# Patient Record
Sex: Male | Born: 1960 | Race: White | Hispanic: No | Marital: Single | Smoking: Light tobacco smoker
Health system: Southern US, Community
[De-identification: ages and names within clinical notes are randomized; demographics above are authoritative.]

## PROBLEM LIST (undated history)

## (undated) DIAGNOSIS — I480 Paroxysmal atrial fibrillation: Secondary | ICD-10-CM

## (undated) DIAGNOSIS — M1990 Unspecified osteoarthritis, unspecified site: Secondary | ICD-10-CM

## (undated) DIAGNOSIS — Z9289 Personal history of other medical treatment: Secondary | ICD-10-CM

## (undated) DIAGNOSIS — J309 Allergic rhinitis, unspecified: Secondary | ICD-10-CM

## (undated) HISTORY — DX: Personal history of other medical treatment: Z92.89

## (undated) HISTORY — DX: Unspecified osteoarthritis, unspecified site: M19.90

## (undated) HISTORY — DX: Paroxysmal atrial fibrillation: I48.0

---

## 2014-12-18 ENCOUNTER — Observation Stay (HOSPITAL_COMMUNITY)
Admission: EM | Admit: 2014-12-18 | Discharge: 2014-12-19 | Disposition: A | Discharge status: Home / Self Care | Payer: BLUE CROSS/BLUE SHIELD | Attending: Internal Medicine | Admitting: Internal Medicine

## 2014-12-18 ENCOUNTER — Emergency Department (HOSPITAL_COMMUNITY): Payer: BLUE CROSS/BLUE SHIELD

## 2014-12-18 ENCOUNTER — Other Ambulatory Visit: Payer: Self-pay

## 2014-12-18 ENCOUNTER — Encounter (HOSPITAL_COMMUNITY): Payer: Self-pay | Admitting: *Deleted

## 2014-12-18 DIAGNOSIS — I480 Paroxysmal atrial fibrillation: Principal | ICD-10-CM | POA: Insufficient documentation

## 2014-12-18 DIAGNOSIS — F17210 Nicotine dependence, cigarettes, uncomplicated: Secondary | ICD-10-CM | POA: Diagnosis not present

## 2014-12-18 DIAGNOSIS — Z720 Tobacco use: Secondary | ICD-10-CM

## 2014-12-18 DIAGNOSIS — I4891 Unspecified atrial fibrillation: Secondary | ICD-10-CM | POA: Diagnosis present

## 2014-12-18 HISTORY — DX: Allergic rhinitis, unspecified: J30.9

## 2014-12-18 LAB — CBC WITH DIFFERENTIAL/PLATELET
BASOS ABS: 0 10*3/uL (ref 0.0–0.1)
BASOS PCT: 0 %
EOS ABS: 0.1 10*3/uL (ref 0.0–0.7)
Eosinophils Relative: 2 %
HCT: 41.8 % (ref 39.0–52.0)
HEMOGLOBIN: 14.1 g/dL (ref 13.0–17.0)
Lymphocytes Relative: 25 %
Lymphs Abs: 2.1 10*3/uL (ref 0.7–4.0)
MCH: 32.6 pg (ref 26.0–34.0)
MCHC: 33.7 g/dL (ref 30.0–36.0)
MCV: 96.8 fL (ref 78.0–100.0)
Monocytes Absolute: 0.6 10*3/uL (ref 0.1–1.0)
Monocytes Relative: 8 %
NEUTROS PCT: 65 %
Neutro Abs: 5.4 10*3/uL (ref 1.7–7.7)
Platelets: 148 10*3/uL — ABNORMAL LOW (ref 150–400)
RBC: 4.32 MIL/uL (ref 4.22–5.81)
RDW: 12.4 % (ref 11.5–15.5)
WBC: 8.3 10*3/uL (ref 4.0–10.5)

## 2014-12-18 LAB — COMPREHENSIVE METABOLIC PANEL
ALBUMIN: 3.8 g/dL (ref 3.5–5.0)
ALT: 16 U/L — AB (ref 17–63)
ANION GAP: 8 (ref 5–15)
AST: 17 U/L (ref 15–41)
Alkaline Phosphatase: 45 U/L (ref 38–126)
BUN: 14 mg/dL (ref 6–20)
CALCIUM: 9 mg/dL (ref 8.9–10.3)
CO2: 27 mmol/L (ref 22–32)
Chloride: 102 mmol/L (ref 101–111)
Creatinine, Ser: 0.92 mg/dL (ref 0.61–1.24)
GFR calc Af Amer: >60 mL/min (ref >60)
GFR calc non Af Amer: >60 mL/min (ref >60)
GLUCOSE: 121 mg/dL — AB (ref 65–99)
Potassium: 3.8 mmol/L (ref 3.5–5.1)
SODIUM: 137 mmol/L (ref 135–145)
Total Bilirubin: 0.6 mg/dL (ref 0.3–1.2)
Total Protein: 5.7 g/dL — ABNORMAL LOW (ref 6.5–8.1)

## 2014-12-18 LAB — TSH: TSH: 1.276 u[IU]/mL (ref 0.350–4.500)

## 2014-12-18 LAB — MAGNESIUM: Magnesium: 2.2 mg/dL (ref 1.7–2.4)

## 2014-12-18 LAB — I-STAT TROPONIN, ED: Troponin i, poc: 0 ng/mL (ref 0.00–0.08)

## 2014-12-18 LAB — HEPARIN LEVEL (UNFRACTIONATED)
HEPARIN UNFRACTIONATED: 0.31 [IU]/mL (ref 0.30–0.70)
HEPARIN UNFRACTIONATED: 0.39 [IU]/mL (ref 0.30–0.70)

## 2014-12-18 MED ORDER — METOPROLOL TARTRATE 25 MG PO TABS
25.0000 mg | ORAL_TABLET | Freq: Once | ORAL | Status: AC
  Administered 2014-12-18: 25 mg via ORAL
  Filled 2014-12-18: qty 1

## 2014-12-18 MED ORDER — HEPARIN BOLUS VIA INFUSION
4000.0000 [IU] | Freq: Once | INTRAVENOUS | Status: AC
  Administered 2014-12-18: 4000 [IU] via INTRAVENOUS
  Filled 2014-12-18: qty 4000

## 2014-12-18 MED ORDER — ACETAMINOPHEN 325 MG PO TABS: 650.0000 mg | ORAL_TABLET | ORAL | Status: DC | PRN

## 2014-12-18 MED ORDER — HEPARIN (PORCINE) IN NACL 100-0.45 UNIT/ML-% IJ SOLN
1450.0000 [IU]/h | INTRAMUSCULAR | Status: DC
  Administered 2014-12-18: 1400 [IU]/h via INTRAVENOUS
  Administered 2014-12-18: 1450 [IU]/h via INTRAVENOUS
  Filled 2014-12-18 (×2): qty 250

## 2014-12-18 MED ORDER — METOPROLOL TARTRATE 25 MG PO TABS
25.0000 mg | ORAL_TABLET | Freq: Two times a day (BID) | ORAL | Status: DC
  Administered 2014-12-18 – 2014-12-19 (×3): 25 mg via ORAL
  Filled 2014-12-18 (×3): qty 1

## 2014-12-18 MED ORDER — ASPIRIN 81 MG PO CHEW
324.0000 mg | CHEWABLE_TABLET | Freq: Once | ORAL | Status: AC
  Administered 2014-12-18: 324 mg via ORAL
  Filled 2014-12-18: qty 4

## 2014-12-18 MED ORDER — ONDANSETRON HCL 4 MG/2ML IJ SOLN: 4.0000 mg | Freq: Four times a day (QID) | INTRAMUSCULAR | Status: DC | PRN

## 2014-12-18 NOTE — Progress Notes (Signed)
Patient admitted early this AM by Dr End, follow up this morning shows rates 90s to low 100s. He is due to get metoprolol this morning and in the evening, will follow how rates respond. He is on hep gtt. Echo pending. Will make NPO tonight in case TEE/DCCV is needed tomorrow.  Dominga Ferry MD

## 2014-12-18 NOTE — Progress Notes (Signed)
ANTICOAGULATION CONSULT NOTE - Initial Consult  Pharmacy Consult for heparin Indication: atrial fibrillation  Allergies  Allergen Reactions  . Novocain [Procaine] Other (See Comments)    Light-headed-"Almost passed out"  . Peanut-Containing Drug Products Other (See Comments)    Itchy throat    Patient Measurements: Height: 6' (182.9 cm) Weight: 204 lb (92.534 kg) IBW/kg (Calculated) : 77.6  Vital Signs: Temp: 98.9 F (37.2 C) (10/09 0129) Temp Source: Oral (10/09 0129) BP: 105/53 mmHg (10/09 0230) Pulse Rate: 132 (10/09 0230)  Labs:  Recent Labs  12/18/14 0203  HGB 14.1  HCT 41.8  PLT 148*  CREATININE 0.92    Estimated Creatinine Clearance: 100.7 mL/min (by C-G formula based on Cr of 0.92).   Medical History: History reviewed. No pertinent past medical history.   Assessment: 54yo male was at a bar when he got lightheaded, EMS came and found pt to be pale and diaphoretic w/ hypotension, pt reports a brief "rumble" in chest, found in ED to be in Afib, to begin heparin; no known PMH so currently CHADS2 = 0.  Goal of Therapy:  Heparin level 0.3-0.7 units/ml Monitor platelets by anticoagulation protocol: Yes   Plan:  Will give heparin 4000 units IV bolus x1 followed by gtt at 1400 units/hr and monitor heparin levels and CBC.  Vernard Gambles, PharmD, BCPS  12/18/2014,4:27 AM

## 2014-12-18 NOTE — H&P (Signed)
History and Physical  Patient ID: Dale Dalton MRN: [id number], DOB: 08-13-1960 Date of Encounter: 12/18/2014, 5:18 AM Primary Physician: No primary care provider on file. Primary Cardiologist: None  Chief Complaint: Dizziness Reason for Admission: Atrial fibrillation with rapid ventricular response  HPI: Dale Dalton is a 54 year old man with history of allergic rhinitis and tobacco abuse, whom we have been asked to evaluate due to atrial fibrillation with rapid ventricular response.  The patient was at a local bar shortly after midnight, when he suddenly became diaphoretic and lightheaded with generalized weakness.  He did not lose consciousness but felt as though he would have difficulty standing up.  He had only consumed 2 beers up until that point.  EMS was summoned and found the patient to be in atrial fibrillation with a rapid ventricular response.  The patient endorses intermittent palpitations, which in retrospect, may have been present over the last few weeks as well.  He also notes an episode of transient dizziness about one month ago which he ascribed to possible water in his ear.  He denies chest pain, shortness of breath, orthopnea, and edema.  He does not have any history of cardiac disease.  He denies fevers as well as significant bleeding, including hematuria, melanoma, and hematochezia.  Patient also denies focal weakness, slurred speech, and other stroke symptoms.  Patient reports he typically consumes about 4 beers per week.  He denies other alcohol and illicit drugs.  He consumes several caffeinated sodas and tea's over the course the day.  Past Medical History  Diagnosis Date  . Allergic rhinitis      Most Recent Cardiac Studies: None    Surgical History: History reviewed. No pertinent past surgical history.   Home Meds: Prior to Admission medications   Not on File    Allergies:  Allergies  Allergen Reactions  . Novocain [Procaine] Other (See Comments)   Light-headed-"Almost passed out"  . Peanut-Containing Drug Products Other (See Comments)    Itchy throat    Social History   Social History  . Marital Status: Single    Spouse Name: N/A  . Number of Children: N/A  . Years of Education: N/A   Occupational History  . Not on file.   Social History Main Topics  . Smoking status: Current Every Day Smoker -- 0.50 packs/day for 35 years  . Smokeless tobacco: Never Used  . Alcohol Use: 2.4 oz/week    4 Cans of beer per week  . Drug Use: No  . Sexual Activity: Not on file   Other Topics Concern  . Not on file   Social History Narrative  . No narrative on file     Family History  Problem Relation Age of Onset  . Cancer Mother   . Cancer Father   . Heart Problems Brother     arrhythmia    Review of Systems: A 12 system review of systems was performed and was negative, except as noted in the history of present illness.  Labs:   Lab Results  Component Value Date   WBC 8.3 12/18/2014   HGB 14.1 12/18/2014   HCT 41.8 12/18/2014   MCV 96.8 12/18/2014   PLT 148* 12/18/2014    Recent Labs Lab 12/18/14 0203  NA 137  K 3.8  CL 102  CO2 27  BUN 14  CREATININE 0.92  CALCIUM 9.0  PROT 5.7*  BILITOT 0.6  ALKPHOS 45  ALT 16*  AST 17  GLUCOSE 121*   POC troponin:  0.00  Radiology/Studies:  Dg Chest 2 View  12/18/2014   CLINICAL DATA:  Lightheaded, pale and diaphoretic.  EXAM: CHEST  2 VIEW  COMPARISON:  None.  FINDINGS: The heart size and mediastinal contours are within normal limits. Both lungs are clear. The visualized skeletal structures are unremarkable.  IMPRESSION: No active cardiopulmonary disease.   Electronically Signed   By: Ellery Plunk M.D.   On: 12/18/2014 03:08   Wt Readings from Last 3 Encounters:  12/18/14 92.534 kg (204 lb)    EKG: Atrial fibrillation with rapid ventricular response and nonspecific T-wave changes.  Physical Exam: Blood pressure 154/107, pulse 131, temperature 98.9 F (37.2  C), temperature source Oral, resp. rate 18, height 6' (1.829 m), weight 92.534 kg (204 lb), SpO2 97 %. General: Well developed, well nourished, in no acute distress.  His brother is at the bedside. Head: Normocephalic, atraumatic, sclera non-icteric, no xanthomas, nares are without discharge.  Neck: Negative for carotid bruits.  JVP approximately 8 cm with positive HJR. Lungs: Clear bilaterally to auscultation without wheezes, rales, or rhonchi. Breathing is unlabored. Heart: Tachycardic and irregularly irregular.  No murmurs. Abdomen: Soft, non-tender, non-distended with normoactive bowel sounds. No hepatomegaly. No rebound/guarding. No obvious abdominal masses. Msk:  Strength and tone appear normal for age. Extremities: No clubbing or cyanosis. No edema.  Distal pedal pulses are 2+ and equal bilaterally. Neuro: Alert and oriented X 3. No focal deficit. No facial asymmetry. Moves all extremities spontaneously. Psych:  Responds to questions appropriately with a normal affect.    ASSESSMENT AND PLAN:  54 year old man with history of allergic rhinitis and tobacco abuse, presenting after a presyncopal episode and being found in atrial fibrillation with rapid ventricular response.  Atrial fibrillation with rapid ventricular response: Patient continues to be in atrial fibrillation, having just received metoprolol tartrate 25 mg by mouth 1 in the emergency department.  As it is not entirely clear when the patient first went into atrial fibrillation, I feel that it is necessary to anticoagulate the patient for at least 4 weeks or exclude intracardiac thrombus by TEE prior to proceeding with a rhythm control strategy.  Initiate heparin infusion with plan to convert to an oral agent prior to discharge, particularly if cardioversion is performed during this admission or planned in the future.  Long term, the patient may be a candidate for aspirin therapy alone for stroke prevention, given his CHADSVASC  score of 0.  We will start metoprolol tartrate 25 mg twice a day, to be titrated up as blood pressure allows for target heart rate less than 100-110 bpm.  Check TSH, magnesium, and transthoracic echocardiogram.  Admit to telemetry, observation status.  Tobacco abuse: Smoking cessation counseling performed.  CODE STATUS: Full code.  Vernia Buff MD 12/18/2014, 5:18 AM

## 2014-12-18 NOTE — Progress Notes (Signed)
ANTICOAGULATION CONSULT NOTE - Follow Up Consult  Pharmacy Consult for heparin Indication: atrial fibrillation  Allergies  Allergen Reactions  . Novocain [Procaine] Other (See Comments)    Light-headed-"Almost passed out"  . Peanut-Containing Drug Products Other (See Comments)    Itchy throat    Patient Measurements: Height: 6' (182.9 cm) Weight: 212 lb 15.4 oz (96.6 kg) IBW/kg (Calculated) : 77.6  Vital Signs: Temp: 99.5 F (37.5 C) (10/09 1016) Temp Source: Oral (10/09 0557) BP: 100/57 mmHg (10/09 1016) Pulse Rate: 98 (10/09 1016)  Labs:  Recent Labs  12/18/14 0203 12/18/14 1259  HGB 14.1  --   HCT 41.8  --   PLT 148*  --   HEPARINUNFRC  --  0.31  CREATININE 0.92  --     Estimated Creatinine Clearance: 110.6 mL/min (by C-G formula based on Cr of 0.92).   Medical History: Past Medical History  Diagnosis Date  . Allergic rhinitis      Assessment: 54yo male was at a bar when he got lightheaded, EMS came and found pt to be pale and diaphoretic w/ hypotension, pt reports a brief "rumble" in chest, found in ED to be in Afib. Currently on IV heparin at 1400 units/hr. H/H wnl, Plt 148K. No s/s of bleeding reported by RN  ECHO pending  Goal of Therapy:  Heparin level 0.3-0.7 units/ml Monitor platelets by anticoagulation protocol: Yes   Plan:  Increase IV heparin infusion to 1450 units/hr F/u confirmatory 6 hr HL  Monitor heparin levels and CBC Likely DCCV tomorrow   Vinnie Level, PharmD., BCPS Clinical Pharmacist Pager [phone]

## 2014-12-18 NOTE — ED Provider Notes (Signed)
CSN: [id number]     Arrival date & time 12/18/14  0117 History  By signing my name below, I, Dale Dalton, attest that this documentation has been prepared under the direction and in the presence of Derwood Kaplan, MD. Electronically Signed: [name], ED Scribe. 12/18/2014. 1:29 AM.    Chief Complaint  Patient presents with  . Irregular Heart Beat    The history is provided by the patient. No language interpreter was used.    HPI Comments: Dale Dalton is a 54 y.o. male who presents to the Emergency Department complaining of feeling suddenly lightheaded and dizzy after drinking 2 beers at a bar tonight. He states he felt like he had to sit down, although he did not pass out. He also started to feel hot and sweaty and had a brief "rumble" in his chest but denies feeling any prolonged palpitations. He denies a history of any known cardiac conditions. He notes one episode of lightheadedness upon waking that occurred 1 month ago, but that resolved. Patient denies any chest pain or SOB. Patient states he is a PPD smoker and occasional social drinker, with 3-4 drinks per week at most. He denies any illicit drug use. Patient states he had dental work recently done, in which he had a crown removed and replaced with a temporary crown.    Past Medical History  Diagnosis Date  . Allergic rhinitis    History reviewed. No pertinent past surgical history. Family History  Problem Relation Age of Onset  . Cancer Mother   . Cancer Father   . Heart Problems Brother     arrhythmia   Social History  Substance Use Topics  . Smoking status: Current Every Day Smoker -- 0.50 packs/day for 35 years  . Smokeless tobacco: Never Used  . Alcohol Use: 2.4 oz/week    4 Cans of beer per week    Review of Systems  Respiratory: Negative for shortness of breath.   Cardiovascular: Negative for chest pain.  Neurological: Positive for dizziness and light-headedness.  All other systems reviewed and are  negative.   Allergies  Novocain and Peanut-containing drug products  Home Medications   Prior to Admission medications   Medication Sig Start Date End Date Taking? Authorizing Provider  aspirin EC 81 MG tablet Take 1 tablet (81 mg total) by mouth daily. 12/19/14   Azalee Course, PA  metoprolol tartrate (LOPRESSOR) 25 MG tablet Take 1 tablet (25 mg total) by mouth 2 (two) times daily. 12/19/14   Azalee Course, PA   BP 116/65 mmHg  Pulse 62  Temp(Src) 98.2 F (36.8 C) (Oral)  Resp 18  Ht 6' (1.829 m)  Wt 212 lb 15.4 oz (96.6 kg)  BMI 28.88 kg/m2  SpO2 99% Physical Exam  Constitutional: He is oriented to person, place, and time. He appears well-developed and well-nourished. No distress.  HENT:  Head: Normocephalic and atraumatic.  Eyes: Conjunctivae and EOM are normal.  Neck: Neck supple. No tracheal deviation present.  Cardiovascular: Normal rate.   Pulmonary/Chest: Effort normal. No respiratory distress.  Musculoskeletal: Normal range of motion.  Neurological: He is alert and oriented to person, place, and time.  Skin: Skin is warm and dry.  Psychiatric: He has a normal mood and affect. His behavior is normal.  Nursing note and vitals reviewed.   ED Course  Procedures (including critical care time)  DIAGNOSTIC STUDIES: Oxygen Saturation is 97% on RA, normal by my interpretation.    COORDINATION OF CARE: 4:23 AM - Discussed  treatment plan with pt at bedside which includes, after discussing risks and benefits of various options, cardioversion. Pt verbalized understanding and agreed to plan.   .Labs Review Labs Reviewed  CBC WITH DIFFERENTIAL/PLATELET - Abnormal; Notable for the following:    Platelets 148 (*)    All other components within normal limits  COMPREHENSIVE METABOLIC PANEL - Abnormal; Notable for the following:    Glucose, Bld 121 (*)    Total Protein 5.7 (*)    ALT 16 (*)    All other components within normal limits  CBC - Abnormal; Notable for the following:     RBC 4.09 (*)    Platelets 141 (*)    All other components within normal limits  HEPARIN LEVEL (UNFRACTIONATED)  MAGNESIUM  TSH  HEPARIN LEVEL (UNFRACTIONATED)  HEPARIN LEVEL (UNFRACTIONATED)  I-STAT TROPOININ, ED    Imaging Review No results found. I have personally reviewed and evaluated these images and lab results as part of my medical decision-making.   EKG Interpretation   Date/Time:  Sunday December 18 2014 01:25:09 EDT Ventricular Rate:  99 PR Interval:    QRS Duration: 89 QT Interval:  358 QTC Calculation: 459 R Axis:   19 Text Interpretation:  Atrial fibrillation Abnormal R-wave progression,  early transition No old tracing to compare Confirmed by Rhunette Croft, MD,  Janey Genta [phone]) on 12/18/2014 1:27:43 AM      MDM   Final diagnoses:  Atrial fibrillation with RVR (HCC)    I personally performed the services described in this documentation, which was scribed in my presence. The recorded information has been reviewed and is accurate.   Pt comes in with cc of dizziness. He is noted to be in afib with RVR. BP is slightly low. PT has no hx of afib. He is not the greatest of historian when it comes to figuring out if has had palpitations in the past or similar symptoms. CHADVASC score is 0.  Consulted Cardiology to see if pt an be cardioverted in the ER. Cardiologist didn't feel comfortable with ED cardioversion, and would prefer traditional workup and admission.  Will give oral metop.  Derwood Kaplan, MD 12/31/14 [phone]

## 2014-12-18 NOTE — Progress Notes (Signed)
ANTICOAGULATION CONSULT NOTE - Follow Up Consult  Pharmacy Consult for heparin Indication: atrial fibrillation  Allergies  Allergen Reactions  . Novocain [Procaine] Other (See Comments)    Light-headed-"Almost passed out"  . Peanut-Containing Drug Products Other (See Comments)    Itchy throat    Patient Measurements: Height: 6' (182.9 cm) Weight: 212 lb 15.4 oz (96.6 kg) IBW/kg (Calculated) : 77.6  Vital Signs: Temp: 98.2 F (36.8 C) (10/09 2000) Temp Source: Oral (10/09 2000) BP: 98/58 mmHg (10/09 2106) Pulse Rate: 62 (10/09 2106)  Labs:  Recent Labs  12/18/14 0203 12/18/14 1259 12/18/14 2054  HGB 14.1  --   --   HCT 41.8  --   --   PLT 148*  --   --   HEPARINUNFRC  --  0.31 0.39  CREATININE 0.92  --   --     Estimated Creatinine Clearance: 110.6 mL/min (by C-G formula based on Cr of 0.92).  Assessment: 54yo male was at a bar when he got lightheaded, EMS came and found pt to be pale and diaphoretic w/ hypotension, pt reports a brief "rumble" in chest, found in ED to be in Afib.   Heparin level is therapeutic at 0.39 on 1450 units/hr. No bleeding noted.  Goal of Therapy:  Heparin level 0.3-0.7 units/ml Monitor platelets by anticoagulation protocol: Yes   Plan:  continue IV heparin infusion at 1450 units/hr  Daily heparin levels and CBC Likely DCCV tomorrow   [facility], [address].D., BCPS Clinical Pharmacist Pager: [phone] 12/18/2014 9:53 PM

## 2014-12-18 NOTE — ED Notes (Signed)
Patient presents via EMS.  He was at a speak easy and got light headed and security helped him sit down.  EMS stated he was pale and diaphoretic.  Never passed out but felt "funny".  Original BP 94/48 now 114/67. CBG 111

## 2014-12-18 NOTE — Progress Notes (Signed)
Telemetry called to inform RN pt has converted from A-fib to NSR; NSR confirmed on tele and EKG completed. PA Simmons notified whiles on the unit. Will closely monitor pt. Dionne Bucy RN

## 2014-12-19 ENCOUNTER — Observation Stay (HOSPITAL_BASED_OUTPATIENT_CLINIC_OR_DEPARTMENT_OTHER): Payer: BLUE CROSS/BLUE SHIELD

## 2014-12-19 ENCOUNTER — Other Ambulatory Visit: Payer: Self-pay | Admitting: Physician Assistant

## 2014-12-19 DIAGNOSIS — I4891 Unspecified atrial fibrillation: Secondary | ICD-10-CM | POA: Diagnosis not present

## 2014-12-19 DIAGNOSIS — I480 Paroxysmal atrial fibrillation: Secondary | ICD-10-CM

## 2014-12-19 LAB — CBC
HCT: 39.9 % (ref 39.0–52.0)
HEMOGLOBIN: 13.3 g/dL (ref 13.0–17.0)
MCH: 32.5 pg (ref 26.0–34.0)
MCHC: 33.3 g/dL (ref 30.0–36.0)
MCV: 97.6 fL (ref 78.0–100.0)
PLATELETS: 141 10*3/uL — AB (ref 150–400)
RBC: 4.09 MIL/uL — AB (ref 4.22–5.81)
RDW: 12.8 % (ref 11.5–15.5)
WBC: 7.6 10*3/uL (ref 4.0–10.5)

## 2014-12-19 LAB — HEPARIN LEVEL (UNFRACTIONATED): Heparin Unfractionated: 0.37 IU/mL (ref 0.30–0.70)

## 2014-12-19 MED ORDER — METOPROLOL TARTRATE 25 MG PO TABS: 25.0000 mg | ORAL_TABLET | Freq: Two times a day (BID) | ORAL | Status: DC

## 2014-12-19 MED ORDER — ASPIRIN EC 81 MG PO TBEC: 81.0000 mg | DELAYED_RELEASE_TABLET | Freq: Every day | ORAL | Status: AC

## 2014-12-19 NOTE — Discharge Summary (Signed)
Discharge Summary   Patient ID: Dale Dalton,  MRN: [id number], DOB/AGE: Jan 14, 1961 54 y.o.  Admit date: 12/18/2014 Discharge date: 12/19/2014  Primary Care Provider: No primary care provider on file. Primary Cardiologist: Dr. Katrinka Blazing  Discharge Diagnoses Principal Problem:   Atrial fibrillation with rapid ventricular response (HCC)   Allergies Allergies  Allergen Reactions  . Novocain [Procaine] Other (See Comments)    Light-headed-"Almost passed out"  . Peanut-Containing Drug Products Other (See Comments)    Itchy throat    Procedures  Echocardiogram 12/19/2014 LV EF: 55% -  60%  ------------------------------------------------------------------- Indications:   Atrial fibrillation - 427.31.  ------------------------------------------------------------------- History:  PMH: Allergic rhinitis.  ------------------------------------------------------------------- Study Conclusions  - Left ventricle: The cavity size was normal. Wall thickness was normal. Systolic function was normal. The estimated ejection fraction was in the range of 55% to 60%. Wall motion was normal; there were no regional wall motion abnormalities. Left ventricular diastolic function parameters were normal. - Mitral valve: There was mild regurgitation. - Right atrium: The atrium was mildly dilated.    Hospital Course  Dale Dalton is a pleasant 54 year old male with history of allergic rhinitis and tobacco abuse however no past cardiac history who presented to [facility] on 12/18/2014 after developing sudden onset of diaphoresis, lightheadedness and generalized weakness at a local bar. Patient states he only consumed 2 beers until that point. He does not drink daily and only drinks occasionally on the weekend. On arrival to the ED, he was noted to be in atrial fibrillation with RVR. He was started on metoprolol 25 mg twice a day with conversion to normal sinus rhythm in the  afternoon of 12/18/2014.  He was seen in the morning of 10/10, at which time he denies any significant chest discomfort or shortness of breath. Echocardiogram was done which showed EF 55-60%, no regional wall motion abnormality, normal left atrial size. Given CHA2DS2-Vasc score of 0. Patient is at very low risk for thromboembolic stroke. The total duration of A. fib was felt to be less than 12 hours. Therefore we will discharge the patient on aspirin only. I have arranged 30 day outpatient event monitor and followup with cardiology service after that. If he has recurrence of atrial fibrillation later, he will need a sleep study to evaluate for obstructive sleep apnea. I have instructed him to avoid alcohol or caffeinated drinks.   Discharge Vitals Blood pressure 116/65, pulse 62, temperature 98.2 F (36.8 C), temperature source Oral, resp. rate 18, height 6' (1.829 m), weight 212 lb 15.4 oz (96.6 kg), SpO2 99 %.  Filed Weights   12/18/14 0131 12/18/14 0557  Weight: 204 lb (92.534 kg) 212 lb 15.4 oz (96.6 kg)    Labs  CBC  Recent Labs  12/18/14 0203 12/19/14 0417  WBC 8.3 7.6  NEUTROABS 5.4  --   HGB 14.1 13.3  HCT 41.8 39.9  MCV 96.8 97.6  PLT 148* 141*   Basic Metabolic Panel  Recent Labs  12/18/14 0203 12/18/14 1207  NA 137  --   K 3.8  --   CL 102  --   CO2 27  --   GLUCOSE 121*  --   BUN 14  --   CREATININE 0.92  --   CALCIUM 9.0  --   MG  --  2.2   Liver Function Tests  Recent Labs  12/18/14 0203  AST 17  ALT 16*  ALKPHOS 45  BILITOT 0.6  PROT 5.7*  ALBUMIN 3.8   Thyroid Function  Tests  Recent Labs  12/18/14 1259  TSH 1.276    Disposition  Pt is being discharged home today in good condition.  Follow-up Plans & Appointments      Follow-up Information    Follow up with Tereso Newcomer, PA-C On 01/23/2015.   Specialties:  Physician Assistant, Radiology, Interventional Cardiology   Why:  11:30am   Contact information:   [address] [place] [address] [phone]       Follow up with [facility] Office On 12/21/2014.   Specialty:  Cardiology   Why:  10:30am. Pick up 30 day event monitor in office.    Contact information:   [address] [place] [address] [phone]      Discharge Medications    Medication List    TAKE these medications        aspirin EC 81 MG tablet  Take 1 tablet (81 mg total) by mouth daily.     metoprolol tartrate 25 MG tablet  Commonly known as:  LOPRESSOR  Take 1 tablet (25 mg total) by mouth 2 (two) times daily.        Outstanding Labs/Studies  30 day event monitor  Duration of Discharge Encounter   Greater than 30 minutes including physician time.  Ramond Dial PA-C Pager: [phone] 12/19/2014, 1:58 PM

## 2014-12-19 NOTE — Discharge Instructions (Signed)
Atrial Fibrillation Atrial fibrillation is a type of heartbeat that is irregular or fast (rapid). If you have this condition, your heart keeps quivering in a weird (chaotic) way. This condition can make it so your heart cannot pump blood normally. Having this condition gives a person more risk for stroke, heart failure, and other heart problems. There are different types of atrial fibrillation. Talk with your doctor to learn about the type that you have. HOME CARE  Take over-the-counter and prescription medicines only as told by your doctor.  If your doctor prescribed a blood-thinning medicine, take it exactly as told. Taking too much of it can cause bleeding. If you do not take enough of it, you will not have the protection that you need against stroke and other problems.  Do not use any tobacco products. These include cigarettes, chewing tobacco, and e-cigarettes. If you need help quitting, ask your doctor.  If you have apnea (obstructive sleep apnea), manage it as told by your doctor.  Do not drink alcohol.  Do not drink beverages that have caffeine. These include coffee, soda, and tea.  Maintain a healthy weight. Do not use diet pills unless your doctor says they are safe for you. Diet pills may make heart problems worse.  Follow diet instructions as told by your doctor.  Exercise regularly as told by your doctor.  Keep all follow-up visits as told by your doctor. This is important. GET HELP IF:  You notice a change in the speed, rhythm, or strength of your heartbeat.  You are taking a blood-thinning medicine and you notice more bruising.  You get tired more easily when you move or exercise. GET HELP RIGHT AWAY IF:  You have pain in your chest or your belly (abdomen).  You have sweating or weakness.  You feel sick to your stomach (nauseous).  You notice blood in your throw up (vomit), poop (stool), or pee (urine).  You are short of breath.  You suddenly have swollen feet  and ankles.  You feel dizzy.  Your suddenly get weak or numb in your face, arms, or legs, especially if it happens on one side of your body.  You have trouble talking, trouble understanding, or both.  Your face or your eyelid droops on one side. These symptoms may be an emergency. Do not wait to see if the symptoms will go away. Get medical help right away. Call your local emergency services (911 in the [place] Do not drive yourself to the hospital.   This information is not intended to replace advice given to you by your health care provider. Make sure you discuss any questions you have with your health care provider.   Document Released: 12/05/2007 Document Revised: 11/16/2014 Document Reviewed: 06/22/2014 Elsevier Interactive Patient Education 2016 [employer].   Avoid caffeinated drinks, avoid alcohol. Seek medical attention if has any one sided weakness, slurring of speech.

## 2014-12-19 NOTE — Progress Notes (Signed)
Patient Name: Dale Dalton Date of Encounter: 12/19/2014  Primary Cardiologist: new - Dr. Katrinka Blazing   Principal Problem:   Atrial fibrillation with rapid ventricular response (HCC)    SUBJECTIVE  Denies any CP or SOB. Palpitation resolved.   CURRENT MEDS . metoprolol tartrate  25 mg Oral BID    OBJECTIVE  Filed Vitals:   12/18/14 1016 12/18/14 2000 12/18/14 2106 12/19/14 0741  BP: 100/57 99/63 98/58  116/65  Pulse: 98 65 62 62  Temp: 99.5 F (37.5 C) 98.2 F (36.8 C)  98.2 F (36.8 C)  TempSrc:  Oral  Oral  Resp: Height:      Weight:      SpO2: 98% 100%  99%    Intake/Output Summary (Last 24 hours) at 12/19/14 0909 Last data filed at 12/18/14 1900  Gross per 24 hour  Intake 763.34 ml  Output    400 ml  Net 363.34 ml   Filed Weights   12/18/14 0131 12/18/14 0557  Weight: 204 lb (92.534 kg) 212 lb 15.4 oz (96.6 kg)    PHYSICAL EXAM  General: Pleasant, NAD. Neuro: Alert and oriented X 3. Moves all extremities spontaneously. Psych: Normal affect. HEENT:  Normal  Neck: Supple without bruits or JVD. Lungs:  Resp regular and unlabored, CTA. Heart: RRR no s3, s4, or murmurs. Abdomen: Soft, non-tender, non-distended, BS + x 4.  Extremities: No clubbing, cyanosis or edema. DP/PT/Radials 2+ and equal bilaterally.  Accessory Clinical Findings  CBC  Recent Labs  12/18/14 0203 12/19/14 0417  WBC 8.3 7.6  NEUTROABS 5.4  --   HGB 14.1 13.3  HCT 41.8 39.9  MCV 96.8 97.6  PLT 148* 141*   Basic Metabolic Panel  Recent Labs  12/18/14 0203 12/18/14 1207  NA 137  --   K 3.8  --   CL 102  --   CO2 27  --   GLUCOSE 121*  --   BUN 14  --   CREATININE 0.92  --   CALCIUM 9.0  --   MG  --  2.2   Liver Function Tests  Recent Labs  12/18/14 0203  AST 17  ALT 16*  ALKPHOS 45  BILITOT 0.6  PROT 5.7*  ALBUMIN 3.8   Thyroid Function Tests  Recent Labs  12/18/14 1259  TSH 1.276    TELE afib with RVR converted to NSR around  14:13pm yesterday. HR 60s    ECG  No new EKG  Echocardiogram  pending    Radiology/Studies  Dg Chest 2 View  12/18/2014   CLINICAL DATA:  Lightheaded, pale and diaphoretic.  EXAM: CHEST  2 VIEW  COMPARISON:  None.  FINDINGS: The heart size and mediastinal contours are within normal limits. Both lungs are clear. The visualized skeletal structures are unremarkable.  IMPRESSION: No active cardiopulmonary disease.   Electronically Signed   By: Ellery Plunk M.D.   On: 12/18/2014 03:08    ASSESSMENT AND PLAN  54 year old man with history of allergic rhinitis and tobacco abuse, presenting after a presyncopal episode and being found in atrial fibrillation with rapid ventricular response.  1. Paroxysmal atrial fibrillation with RVR  - Long term, the patient may be a candidate for aspirin therapy alone for stroke prevention, given his CHADSVASC score of 0.  - TSH normal. Pending echo  - Converted on PO metoprolol. Plan for discharge on PO metoprolol. Given conversion, will discuss with MD, possibly start eliquis  BID for 1 month, then convert  to ASA daily afterward. He denies any major bleeding issue.  - discharge today after echo. Instructed to limit caffeinated soda and alcohol   Signed, Azalee Course PA-C Pager: [phone]

## 2014-12-19 NOTE — Progress Notes (Signed)
Pt discharge education and instructions completed; pt voices understanding and denies any questions. Pt IV and telemetry removed; pt discharge home with family to transport him home. Pt to pick up electronically sent prescription from preferred pharmacy on file. Pt transported off unit via wheelchair with belongings and family at side. Arabella Merles Bronco Mcgrory RN.

## 2014-12-19 NOTE — Progress Notes (Signed)
  Echocardiogram 2D Echocardiogram has been performed.  Dale Dalton 12/19/2014, 11:37 AM

## 2014-12-19 NOTE — Progress Notes (Signed)
ANTICOAGULATION CONSULT NOTE - Follow Up Consult  Pharmacy Consult:  Heparin Indication: atrial fibrillation  Allergies  Allergen Reactions  . Novocain [Procaine] Other (See Comments)    Light-headed-"Almost passed out"  . Peanut-Containing Drug Products Other (See Comments)    Itchy throat    Patient Measurements: Height: 6' (182.9 cm) Weight: 212 lb 15.4 oz (96.6 kg) IBW/kg (Calculated) : 77.6  Heparin dosing weight = 97kg  Vital Signs: Temp: 98.2 F (36.8 C) (10/10 0741) Temp Source: Oral (10/10 0741) BP: 116/65 mmHg (10/10 0741) Pulse Rate: 62 (10/10 0741)  Labs:  Recent Labs  12/18/14 0203 12/18/14 1259 12/18/14 2054 12/19/14 0417  HGB 14.1  --   --  13.3  HCT 41.8  --   --  39.9  PLT 148*  --   --  141*  HEPARINUNFRC  --  0.31 0.39 0.37  CREATININE 0.92  --   --   --     Estimated Creatinine Clearance: 110.6 mL/min (by C-G formula based on Cr of 0.92).     Assessment: 54 YOM continues on IV heparin for Afib (CHADsVASc = 0).  Heparin level therapeutic; no bleeding reported.  Noted plan for possible TEE/DVVT today.   Goal of Therapy:  Heparin level 0.3-0.7 units/ml Monitor platelets by anticoagulation protocol: Yes    Plan:  - Continue heparin gtt at 1450 units/hr - Daily HL / CBC - F/U with [facility] plan post DCCV    Brecken Walth D. Laney Potash, PharmD, BCPS Pager:  [phone] 12/19/2014, 8:40 AM

## 2014-12-21 ENCOUNTER — Ambulatory Visit (INDEPENDENT_AMBULATORY_CARE_PROVIDER_SITE_OTHER): Payer: BLUE CROSS/BLUE SHIELD

## 2014-12-21 DIAGNOSIS — I480 Paroxysmal atrial fibrillation: Secondary | ICD-10-CM

## 2014-12-22 ENCOUNTER — Telehealth: Payer: Self-pay

## 2014-12-22 NOTE — Telephone Encounter (Signed)
Patient called back. Patient stated he was fine. Patient stated he was having problems with the monitor and it's going crazy on him, so he just shut it off. Encouraged patient to call [employer] to have them help him fix his monitor when he gets off work. Patient stated he would take a look at it when he gets off work.

## 2014-12-22 NOTE — Telephone Encounter (Signed)
[employer] called stated that the patient had sent a transmission to them. The rhythm at the time was NSR HR 71, but patient selected that he was SOB, lightheadedness, and passed out. No one has been able to contact patient to verify these symptoms. Called patient's home, talked to a older gentleman, patient's brother,  that could only tell me that patient was at work. He would not tell me which [employer] patient worked at. The same gentleman informed me that I could talk to the patient later when he gets home from work, or hang up and call back to leave a message on the answering machine. Informed the gentleman at the home number that I was a doctor's office calling and I really needed to get in contact with patient. The gentleman repeated the suggestion as written above.  I informed him that I would call patient's mobile number. The patient's father then informed me that I could do that, but the patient has his mobile phone off while he is at work. Called patient's mobile number and left message for patient to call our office as soon as possible. Then I called all the [name] in [name] to see if he was at work. There was no Dale Dalton at any of the stores. Will wait to see if patient calls back. Called patient's emergency contact, Dale Dalton to see if he could help locate his brother. 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 Called the [employer] in [place], phone just rang and rang, no answer.

## 2014-12-22 NOTE — Telephone Encounter (Signed)
F/u       Pt calling Pam back, states he will be at work until 4:30 and he can be reached there.

## 2014-12-22 NOTE — Telephone Encounter (Signed)
Left message to call back  

## 2015-01-19 ENCOUNTER — Telehealth: Payer: Self-pay

## 2015-01-22 NOTE — Progress Notes (Signed)
Cardiology Office Note   Date:  01/23/2015   ID:  Dale Dalton, DOB 11/08/1960, MRN [id number]   Patient Care Team: Lyn RecordsHenry W Smith, MD as Consulting Physician (Cardiology)    Chief Complaint  Patient presents with  . Hospitalization Follow-up  . Atrial Fibrillation     History of Present Illness: Dale Dalton is a 54 y.o. male with a hx of allergic rhinitis and tobacco abuse.  Admitted 10/9-10/10 with AF with RVR.  He had sudden onset of diaphoresis, lightheadedness, generalized weakness.  He converted to NSR with Metoprolol Tartrate 25 mg Twice daily.  Echo demonstrated normal EF and mild MR, mild RAE.  CHADS2-VASc=0.  He was not placed on anticoagulation.  He was to be DC with an event monitor to assess for recurrent AF and its frequency.  He returns for FU.  Event monitor was received after the patient arrived today. I reviewed it while he was in the room. This demonstrated sinus rhythm and sinus bradycardia. No episodes of atrial fibrillation are noted. The patient tells me he had one episode about 2 weeks ago of dizziness and palpitations. This felt similar to his presentation with atrial fibrillation. On the date in question, he was in normal sinus rhythm. He denies chest pain, short of breath, syncope, orthopnea, PND or edema.   Studies/Reports Reviewed Today:  Echo 12/19/14 EF 55-60%, normal wall motion, mild MR, mild RAE   Past Medical History  Diagnosis Date  . Allergic rhinitis   . PAF (paroxysmal atrial fibrillation) (HCC)     CHADS2-VASc=0  . History of echocardiogram     Echo 10/16:EF 55-60%, normal wall motion, mild MR, mild RAE    No past surgical history on file.   Current Outpatient Prescriptions  Medication Sig Dispense Refill  . aspirin EC 81 MG tablet Take 1 tablet (81 mg total) by mouth daily.    . metoprolol tartrate (LOPRESSOR) 25 MG tablet Take 1 tablet (25 mg total) by mouth 2 (two) times daily. 60 tablet 5   No current  facility-administered medications for this visit.    Allergies:   Novocain and Peanut-containing drug products    Social History:   Social History   Social History  . Marital Status: Single    Spouse Name: N/A  . Number of Children: N/A  . Years of Education: N/A   Social History Main Topics  . Smoking status: Current Every Day Smoker -- 0.50 packs/day for 35 years  . Smokeless tobacco: Never Used  . Alcohol Use: 2.4 oz/week    4 Cans of beer per week  . Drug Use: No  . Sexual Activity: Not Asked   Other Topics Concern  . None   Social History Narrative     Family History:   Family History  Problem Relation Age of Onset  . Cancer Mother   . Cancer Father   . Heart Problems Brother     arrhythmia      ROS:   Please see the history of present illness.   Review of Systems  HENT: Positive for headaches.   Cardiovascular: Positive for irregular heartbeat.  Musculoskeletal: Positive for back pain and myalgias.  Neurological: Positive for dizziness.      PHYSICAL EXAM: VS:  BP 118/62 mmHg  Pulse 63  Ht 6' (1.829 m)  Wt 210 lb 6.4 oz (95.437 kg)  BMI 28.53 kg/m2    Wt Readings from Last 3 Encounters:  01/23/15 210 lb 6.4 oz (95.437 kg)  12/18/14  212 lb 15.4 oz (96.6 kg)     GEN: Well nourished, well developed, in no acute distress HEENT: normal Neck: no JVD,  no masses Cardiac:  Normal S1/S2, RRR; no murmur ,  no rubs or gallops, no edema   Respiratory:  clear to auscultation bilaterally, no wheezing, rhonchi or rales. GI: soft, nontender, nondistended, + BS MS: no deformity or atrophy Skin: warm and dry  Neuro:  CNs II-XII intact, Strength and sensation are intact Psych: Normal affect   EKG:  EKG is ordered today.  It demonstrates:   NSR, HR 63, normal axis, QTc 388 ms   Recent Labs: 12/18/2014: ALT 16*; BUN 14; Creatinine, Ser 0.92; Magnesium 2.2; Potassium 3.8; Sodium 137; TSH 1.276 12/19/2014: Hemoglobin 13.3; Platelets 141*    Lipid  Panel No results found for: CHOL, TRIG, HDL, CHOLHDL, VLDL, LDLCALC, LDLDIRECT    ASSESSMENT AND PLAN:  1. PAF:  Maintaining NSR. He had an episode recently that felt like atrial fibrillation. However, his event monitor demonstrated normal sinus rhythm at that time.  CHADS2-VASc=0.  He does not need anticoagulation. If he has documented recurrence of AF in the future, will consider Flecainide 50 mg bid with FU ETT-Nuclear study to r/o pro-arrhythmia and ischemic heart disease.  He will ask his brother who lives with him if he snores.  If so, will obtain a sleep study.  He knows to FU sooner if he has recurrent episodes of symptoms that remind him of AFib.  2. Tobacco Abuse:  He is trying to quit.       Medication Changes: Current medicines are reviewed at length with the patient today.  Concerns regarding medicines are as outlined above.  The following changes have been made:   Discontinued Medications   No medications on file   Modified Medications   No medications on file   New Prescriptions   No medications on file   Labs/ tests ordered today include:   No orders of the defined types were placed in this encounter.     Disposition:    FU with Dr. Verdis Prime 3 mos.     Signed, Brynda Rim, MHS 01/23/2015 4:43 PM    [facility] [address] [place], [place], [place]  [address] Phone: [phone]; Fax: [phone]

## 2015-01-23 ENCOUNTER — Encounter: Payer: Self-pay | Admitting: Physician Assistant

## 2015-01-23 ENCOUNTER — Ambulatory Visit (INDEPENDENT_AMBULATORY_CARE_PROVIDER_SITE_OTHER): Payer: BLUE CROSS/BLUE SHIELD | Admitting: Physician Assistant

## 2015-01-23 VITALS — BP 118/62 | HR 63 | Ht 72.0 in | Wt 210.4 lb

## 2015-01-23 DIAGNOSIS — Z720 Tobacco use: Secondary | ICD-10-CM | POA: Diagnosis not present

## 2015-01-23 DIAGNOSIS — I480 Paroxysmal atrial fibrillation: Secondary | ICD-10-CM

## 2015-01-23 NOTE — Telephone Encounter (Signed)
ERROR

## 2015-01-23 NOTE — Patient Instructions (Addendum)
Medication Instructions:   Your physician recommends that you continue on your current medications as directed. Please refer to the Current Medication list given to you today.    If you need a refill on your cardiac medications before your next appointment, please call your pharmacy.  Labwork: NONE ORDER TODAY    Testing/Procedures: NONE ORDER TODAY    Follow-Up:   IN 3 MONTHS WITH DR Katrinka BlazingSMITH    Any Other Special Instructions Will Be Listed Below (If Applicable).  YOU HAVE BEEN RECCOMMENDED TO USE CLARITIN AND FLONASE FOR CONGESTION  AND ASK YOUR BROTHER IF HE CAN MONITOR YOUR SLEEP TO SEE IF YOU SNORE IF SO PLEASE CONTACT [facility]   [facility] CAROL FIATO IF NOT  AVAILABLE   NURSE TRIAGE DEPT.Marland Kitchen. WITH FEEDBACK

## 2015-02-01 ENCOUNTER — Telehealth: Payer: Self-pay | Admitting: Interventional Cardiology

## 2015-02-01 NOTE — Telephone Encounter (Signed)
LM on his VM (per pt OK to leave detailed message on VM) that per Dr. Katrinka BlazingSmith read monitor which did not show AF and that no changes to medications unless has recurrent AF.

## 2015-02-01 NOTE — Telephone Encounter (Signed)
F/u    Pt returning Lisa's call for monitor results. Pt states it is ok to leave detailed message on vm.

## 2015-03-14 ENCOUNTER — Telehealth: Payer: Self-pay | Admitting: Interventional Cardiology

## 2015-03-14 NOTE — Telephone Encounter (Signed)
New message     Pt want to know if it is ok to use stop smoking patches, gum, etc with his heart medication?  OK to leave msg on vm

## 2015-03-14 NOTE — Telephone Encounter (Signed)
Fwd to Dr.Smith to advise 

## 2015-03-20 NOTE — Telephone Encounter (Signed)
Yes

## 2015-03-21 NOTE — Telephone Encounter (Signed)
Called to give pt Dr.Smith's response below. lmom as pt requested

## 2015-04-28 ENCOUNTER — Ambulatory Visit: Payer: BLUE CROSS/BLUE SHIELD | Admitting: Interventional Cardiology

## 2015-05-01 DIAGNOSIS — Z720 Tobacco use: Secondary | ICD-10-CM | POA: Insufficient documentation

## 2015-05-02 ENCOUNTER — Ambulatory Visit (INDEPENDENT_AMBULATORY_CARE_PROVIDER_SITE_OTHER): Payer: BLUE CROSS/BLUE SHIELD | Admitting: Interventional Cardiology

## 2015-05-02 ENCOUNTER — Encounter: Payer: Self-pay | Admitting: Interventional Cardiology

## 2015-05-02 VITALS — BP 116/62 | HR 64 | Ht 72.0 in | Wt 216.8 lb

## 2015-05-02 DIAGNOSIS — I480 Paroxysmal atrial fibrillation: Secondary | ICD-10-CM | POA: Diagnosis not present

## 2015-05-02 DIAGNOSIS — I4891 Unspecified atrial fibrillation: Secondary | ICD-10-CM | POA: Diagnosis not present

## 2015-05-02 DIAGNOSIS — Z720 Tobacco use: Secondary | ICD-10-CM

## 2015-05-02 MED ORDER — METOPROLOL TARTRATE 25 MG PO TABS: 25.0000 mg | ORAL_TABLET | Freq: Two times a day (BID) | ORAL | Status: DC

## 2015-05-02 NOTE — Patient Instructions (Signed)

## 2015-05-02 NOTE — Progress Notes (Signed)
Cardiology Office Note   Date:  05/02/2015   ID:  Dale Dalton, DOB 1960/11/08, MRN [id number]  PCP:  No primary care provider on file.  Cardiologist:  Lesleigh Noe, MD   Chief Complaint  Patient presents with  . Atrial Fibrillation      History of Present Illness: Dale Dalton is a 55 y.o. male who presents for follow-up of atrial fibrillation that included a single episode with rapid ventricular response. The patient has had no recurrences of tachycardia. He denies neurological complaints. He has not had syncope or chest pain.    Past Medical History  Diagnosis Date  . Allergic rhinitis   . PAF (paroxysmal atrial fibrillation) (HCC)     CHADS2-VASc=0  . History of echocardiogram     Echo 10/16:EF 55-60%, normal wall motion, mild MR, mild RAE    No past surgical history on file.   Current Outpatient Prescriptions  Medication Sig Dispense Refill  . aspirin EC 81 MG tablet Take 1 tablet (81 mg total) by mouth daily.    . metoprolol tartrate (LOPRESSOR) 25 MG tablet Take 1 tablet (25 mg total) by mouth 2 (two) times daily. 60 tablet 5   No current facility-administered medications for this visit.    Allergies:   Novocain and Peanut-containing drug products    Social History:  The patient  reports that he has been smoking.  He has never used smokeless tobacco. He reports that he drinks about 2.4 oz of alcohol per week. He reports that he does not use illicit drugs.   Family History:  The patient's family history includes Cancer in his father and mother; Heart Problems in his brother.    ROS:  Please see the history of present illness.   Otherwise, review of systems are positive for back pain, dizziness, and anxiety..   All other systems are reviewed and negative.    PHYSICAL EXAM: VS:  BP 116/62 mmHg  Pulse 64  Ht 6' (1.829 m)  Wt 216 lb 12.8 oz (98.34 kg)  BMI 29.40 kg/m2 , BMI Body mass index is 29.4 kg/(m^2). GEN: Well nourished, well developed,  in no acute distress HEENT: normal Neck: no JVD, carotid bruits, or masses Cardiac: RRR.  There is no murmur, rub, or gallop. There is no edema. Respiratory:  clear to auscultation bilaterally, normal work of breathing. GI: soft, nontender, nondistended, + BS MS: no deformity or atrophy Skin: warm and dry, no rash Neuro:  Strength and sensation are intact Psych: euthymic mood, full affect   EKG:  EKG is not ordered today.  30 day monitor revealed no recurrences of atrial fibrillation   Recent Labs: 12/18/2014: ALT 16*; BUN 14; Creatinine, Ser 0.92; Magnesium 2.2; Potassium 3.8; Sodium 137; TSH 1.276 12/19/2014: Hemoglobin 13.3; Platelets 141*    Lipid Panel No results found for: CHOL, TRIG, HDL, CHOLHDL, VLDL, LDLCALC, LDLDIRECT    Wt Readings from Last 3 Encounters:  05/02/15 216 lb 12.8 oz (98.34 kg)  01/23/15 210 lb 6.4 oz (95.437 kg)  12/18/14 212 lb 15.4 oz (96.6 kg)      Other studies Reviewed: Additional studies/ records that were reviewed today include: Personally reviewed the 30 day monitor. The findings include no atrial fibrillation.    ASSESSMENT AND PLAN:  1. PAF (paroxysmal atrial fibrillation) No recurrence  2. Tobacco abuse Continued use  3. Alcohol use He has cut back somewhat    Current medicines are reviewed at length with the patient today.  The patient  has the following concerns regarding medicines: No medication side effects on metoprolol.  The following changes/actions have been instituted:    Surveillance  Labs/ tests ordered today include:  No orders of the defined types were placed in this encounter.     Disposition:   FU with HS in 1 year  Signed, Lesleigh Noe, MD  05/02/2015 8:06 AM    [facility] [address] [place], [place], [place]  [address] Phone: [phone]; Fax: [phone]

## 2016-05-02 NOTE — Progress Notes (Signed)
Cardiology Office Note    Date:  05/03/2016   ID:  Dale Dalton Maclachlan, DOB 10/09/1960, MRN [id number]  PCP:  No PCP Per Patient  Cardiologist: Lesleigh NoeHenry W Serenity Batley III, MD   Chief Complaint  Patient presents with  . Atrial Fibrillation    History of Present Illness:  Dale Dalton Waltrip is a 56 y.o. male presents for follow-up of atrial fibrillation that included a single episode with rapid ventricular response.   He had one episode of atrial fibrillation and October 2016. He feels sluggish. He wonders if the medication is contributing. He has rare brief palpitations.   Past Medical History:  Diagnosis Date  . Allergic rhinitis   . 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 History of echocardiogram    Echo 10/16:EF 55-60%, normal wall motion, mild MR, mild RAE  . PAF (paroxysmal atrial fibrillation) (HCC)    CHADS2-VASc=0    No past surgical history on file.  Current Medications: Outpatient Medications Prior to Visit  Medication Sig Dispense Refill  . aspirin EC 81 MG tablet Take 1 tablet (81 mg total) by mouth daily.    . metoprolol tartrate (LOPRESSOR) 25 MG tablet Take 1 tablet (25 mg total) by mouth 2 (two) times daily. 180 tablet 3   No facility-administered medications prior to visit.      Allergies:   Novocain [procaine] and Peanut-containing drug products   Social History   Social History  . Marital status: Single    Spouse name: N/A  . Number of children: N/A  . Years of education: N/A   Social History Main Topics  . Smoking status: Current Every Day Smoker    Packs/day: 0.25    Years: 35.00  . Smokeless tobacco: Never Used  . Alcohol use 2.4 oz/week    4 Cans of beer per week  . Drug use: No  . Sexual activity: Not Asked   Other Topics Concern  . None   Social History Narrative  . None     Family History:  The patient's family history includes Cancer in his father and mother; Heart Problems in his brother.   ROS:   Please see the history of present illness.      Snores but not severely. His brother has observed him sleep. He is not married. No other significant complaints other than nasal drainage in the morning  All other systems reviewed and are negative.   PHYSICAL EXAM:   VS:  BP 124/78   Pulse (!) 56   Ht 6' (1.829 m)   Wt 218 lb 12.8 oz (99.2 kg)   BMI 29.67 kg/m    GEN: Well nourished, well developed, in no acute distress  HEENT: normal  Neck: no JVD, carotid bruits, or masses Cardiac: RRR; no murmurs, rubs, or gallops,no edema  Respiratory:  clear to auscultation bilaterally, normal work of breathing GI: soft, nontender, nondistended, + BS MS: no deformity or atrophy  Skin: warm and dry, no rash Neuro:  Alert and Oriented x 3, Strength and sensation are intact Psych: euthymic mood, full affect  Wt Readings from Last 3 Encounters:  05/03/16 218 lb 12.8 oz (99.2 kg)  05/02/15 216 lb 12.8 oz (98.3 kg)  01/23/15 210 lb 6.4 oz (95.4 kg)      Studies/Labs Reviewed:   EKG:  EKG  Sinus bradycardia.   Recent Labs: No results found for requested labs within last 8760 hours.   Lipid Panel No results found for: CHOL, TRIG, HDL, CHOLHDL, VLDL,  LDLCALC, LDLDIRECT  Additional studies/ records that were reviewed today include:  Echocardiogram 2016:  Study Conclusions  - Left ventricle: The cavity size was normal. Wall thickness was   normal. Systolic function was normal. The estimated ejection   fraction was in the range of 55% to 60%. Wall motion was normal;   there were no regional wall motion abnormalities. Left   ventricular diastolic function parameters were normal. - Mitral valve: There was mild regurgitation. - Right atrium: The atrium was mildly dilated.    ASSESSMENT:    1. Atrial fibrillation with rapid ventricular response (HCC)   2. Tobacco abuse      PLAN:  In order of problems listed above:  1. No recurrences of atrial fibrillation. We will continue to observe. I do not feel that prolonged monitoring  is needed. That episode he had in 2016 caused him to be very symptomatic. He has not had in the remote similar complaints. 2. He complains of fatigue. He does snore. He does not have fatigue upon awakening in the mornings. Sleep apnea may be a consideration. If ever recurrent atrial fibrillation occurs, he will need to have a sleep study. He feels fatigue is related to the medication. Decrease metoprolol to 25 mg per day of the succinate preparation.  Clinical follow-up in one year on Toprol succinate 25 mg per day. Consider sleep study. Consider prolonged monitoring. Notify us if recurrent prolonged palpitations.  Medication Adjustments/Labs and Tests Ordered: Current medicines are reviewed at length with the patient today.  Concerns regarding medicines are outlined above.  Medication changes, Labs and Tests ordered today are listed in the Patient Instructions below. There are no Patient Instructions on file for this visit.   Signed, Lesleigh Noe, MD  05/03/2016 8:17 AM    [facility] [address] [place], [place], [place]  [address] Phone: [phone]; Fax: [phone]

## 2016-05-03 ENCOUNTER — Encounter: Payer: Self-pay | Admitting: *Deleted

## 2016-05-03 ENCOUNTER — Encounter (INDEPENDENT_AMBULATORY_CARE_PROVIDER_SITE_OTHER): Payer: Self-pay

## 2016-05-03 ENCOUNTER — Encounter: Payer: Self-pay | Admitting: Interventional Cardiology

## 2016-05-03 ENCOUNTER — Ambulatory Visit (INDEPENDENT_AMBULATORY_CARE_PROVIDER_SITE_OTHER): Payer: BLUE CROSS/BLUE SHIELD | Admitting: Interventional Cardiology

## 2016-05-03 VITALS — BP 124/78 | HR 56 | Ht 72.0 in | Wt 218.8 lb

## 2016-05-03 DIAGNOSIS — Z720 Tobacco use: Secondary | ICD-10-CM

## 2016-05-03 DIAGNOSIS — R5383 Other fatigue: Secondary | ICD-10-CM

## 2016-05-03 DIAGNOSIS — I4891 Unspecified atrial fibrillation: Secondary | ICD-10-CM | POA: Diagnosis not present

## 2016-05-03 MED ORDER — METOPROLOL SUCCINATE ER 25 MG PO TB24: 25.0000 mg | ORAL_TABLET | Freq: Every day | ORAL | 3 refills | Status: DC

## 2016-05-03 NOTE — Patient Instructions (Signed)
Medication Instructions:  1) Discontinue Metoprolol Tartrate 2) START Metoprolol Succinate 25mg  once daily  Labwork: None  Testing/Procedures: None  Follow-Up: Your physician wants you to follow-up in: 1 year with Dr. Katrinka BlazingSmith. You will receive a reminder letter in the mail two months in advance. If you don't receive a letter, please call our office to schedule the follow-up appointment.   Any Other Special Instructions Will Be Listed Below (If Applicable).     If you need a refill on your cardiac medications before your next appointment, please call your pharmacy.

## 2016-09-05 IMAGING — CR DG CHEST 2V
2 series · 2 of 2 positions shown · non-contrast
Comparison: None.

CLINICAL DATA: Lightheaded, pale and diaphoretic.

EXAM:
CHEST  2 VIEW

[chest pa]
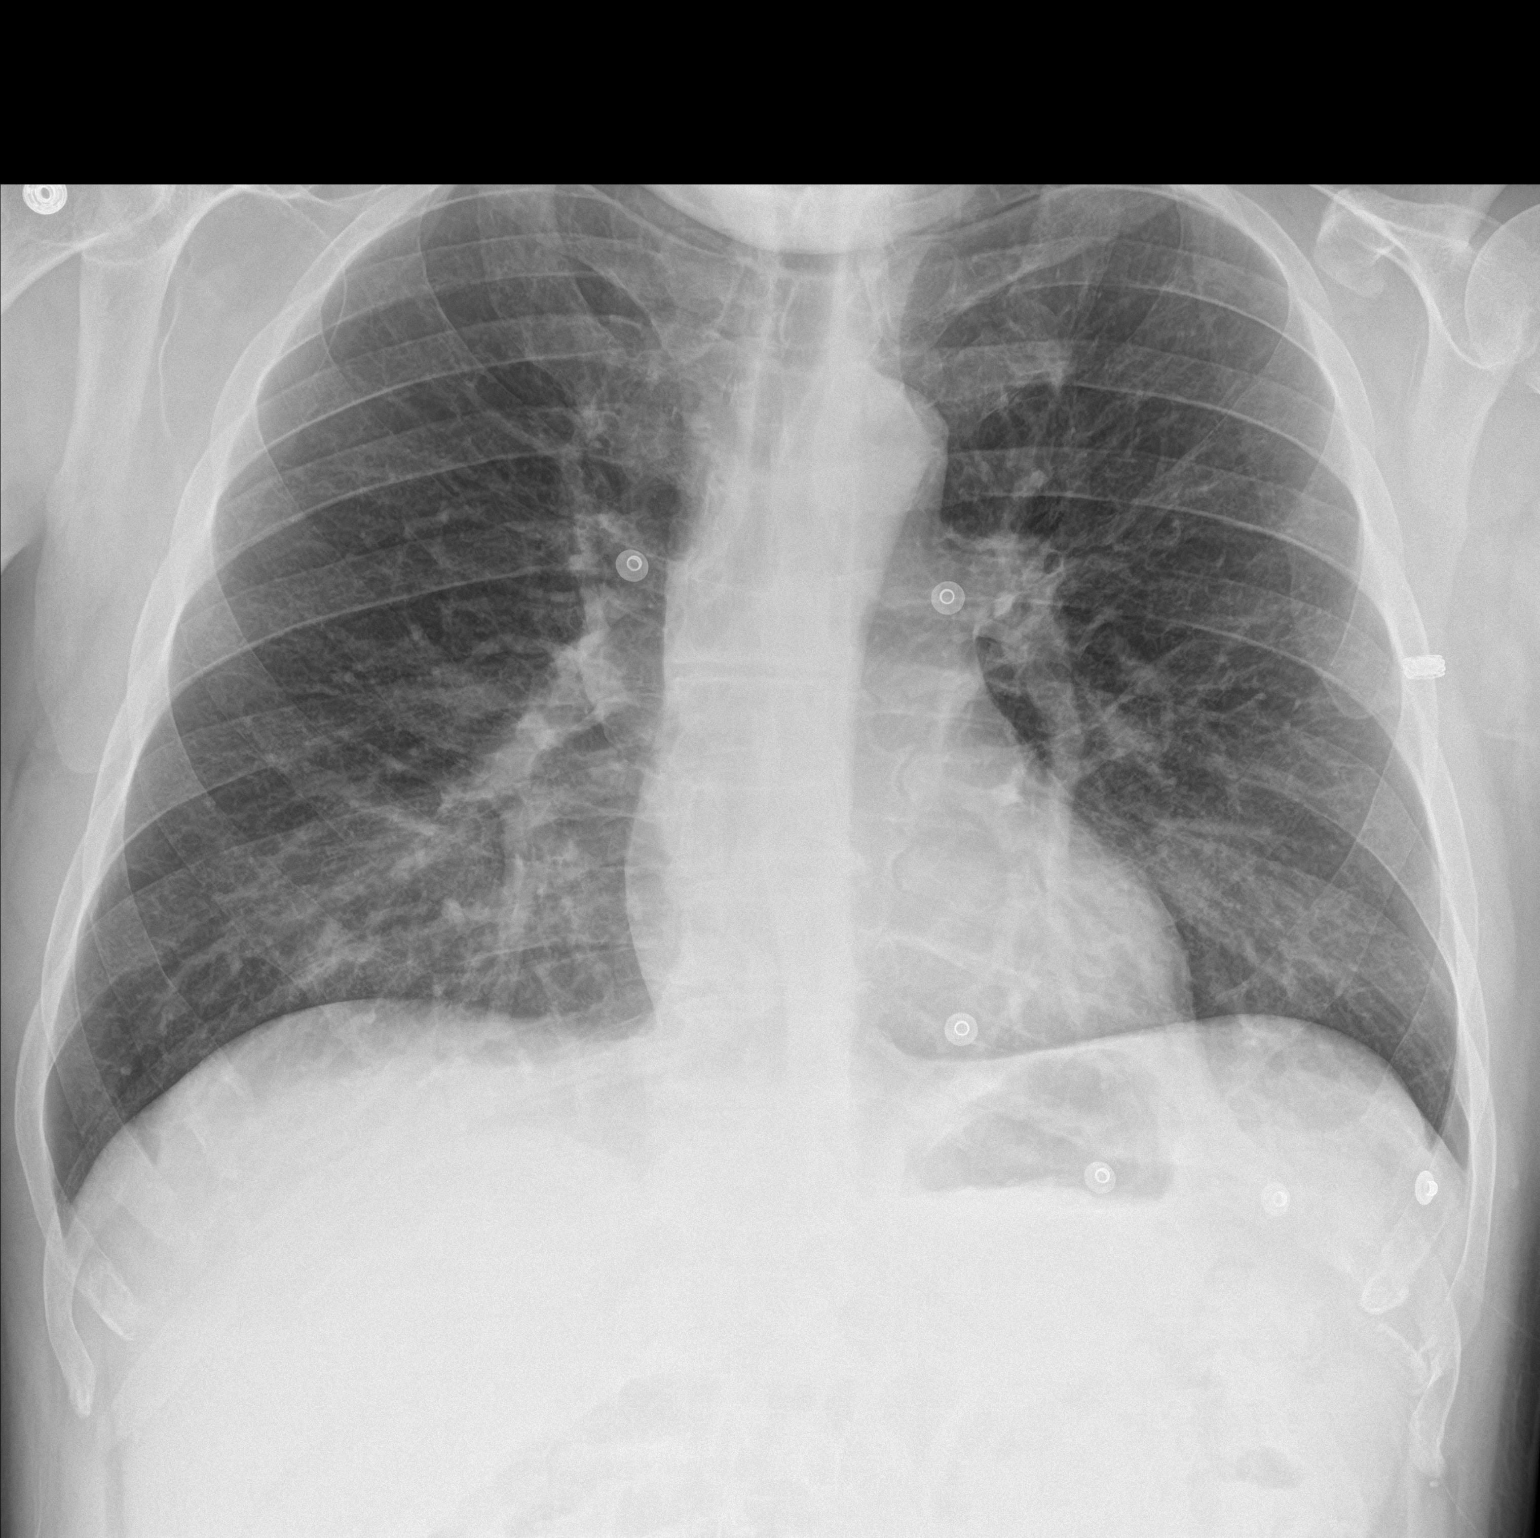

[chest lat]
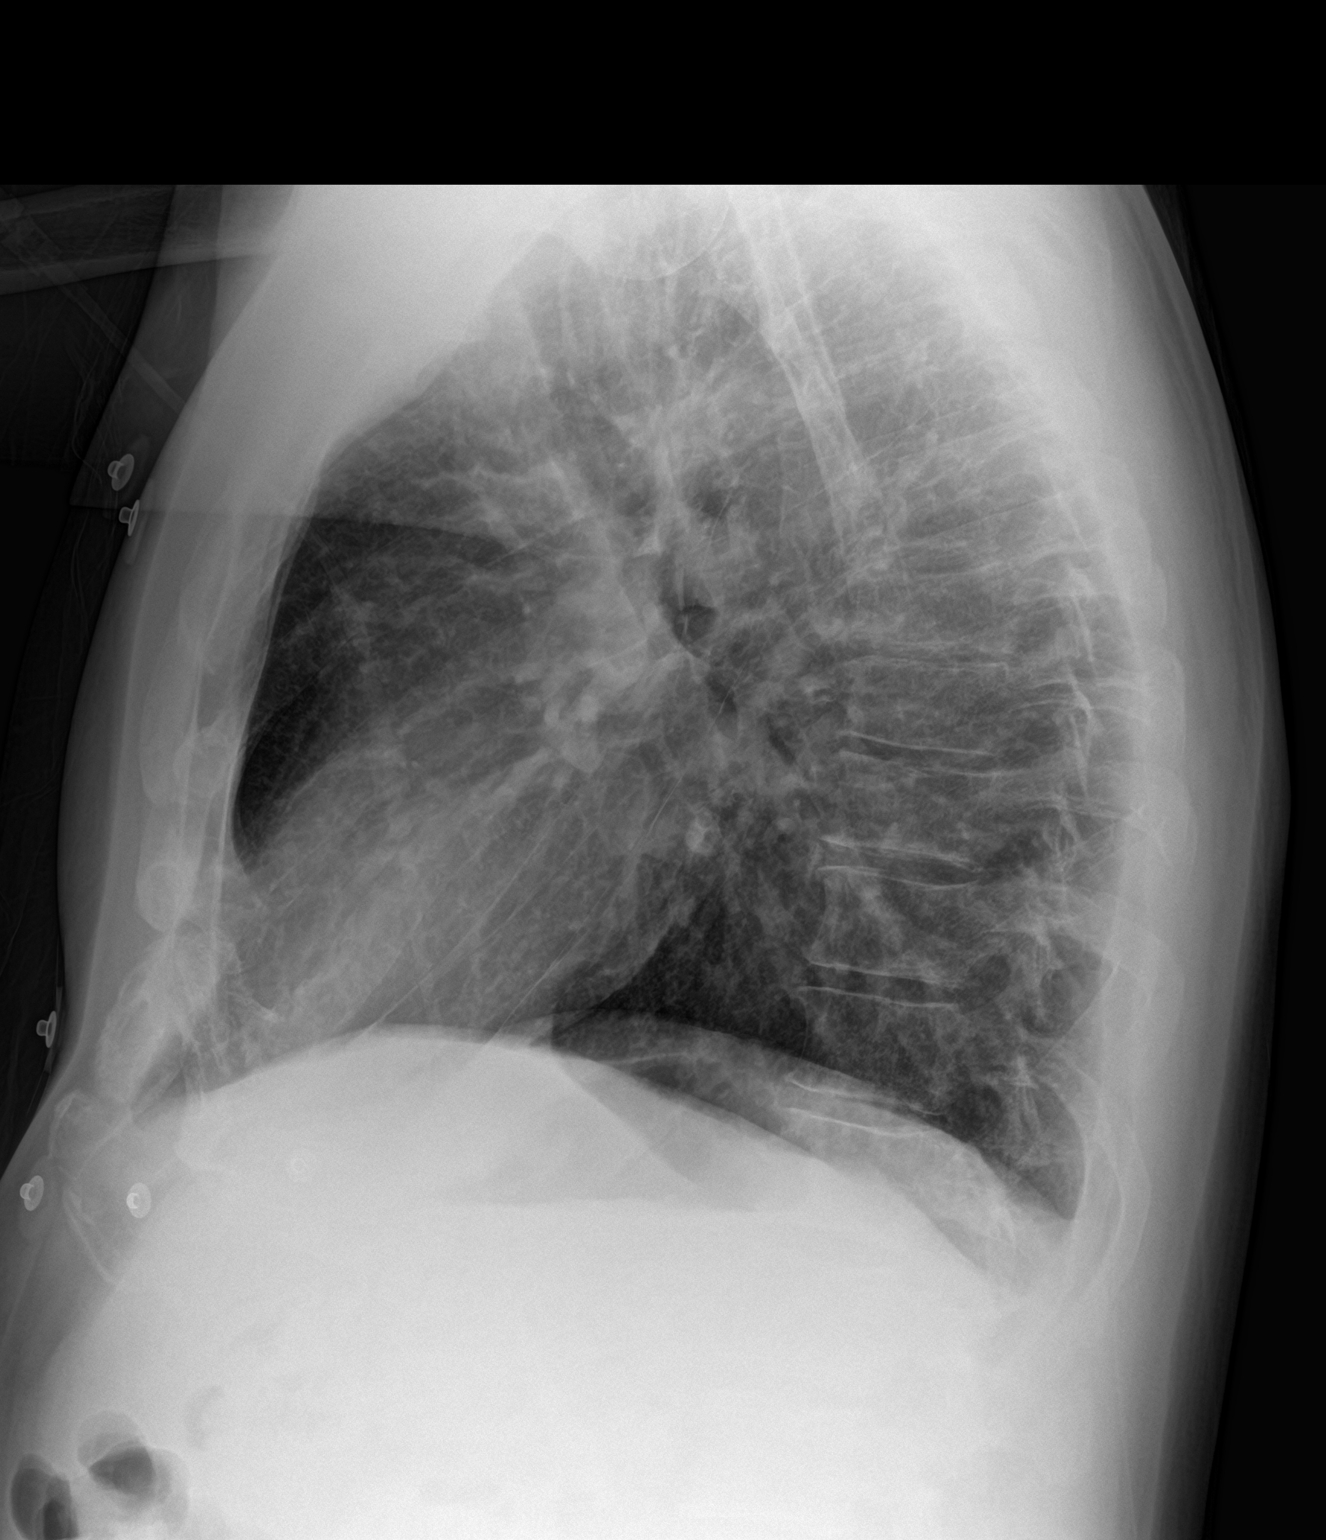

[2 of 2 positions shown; findings below may reference images not displayed]

FINDINGS: The heart size and mediastinal contours are within normal limits.
Both lungs are clear. The visualized skeletal structures are
unremarkable.
IMPRESSION: No active cardiopulmonary disease.

## 2017-03-19 ENCOUNTER — Telehealth: Payer: Self-pay | Admitting: Interventional Cardiology

## 2017-03-19 NOTE — Telephone Encounter (Signed)
Pt states he hasn't been in Afib in almost 2 yrs. Pt states he is very aware of when he is in Afib.  States today at work he was just standing there and suddenly felt himself go into Afib.  States he became very lightheaded, sat down and drank some water and sx resolved.  Episode only lasted about 10-15 mins.  Pt states he feels ok now, just very fatigued.  Pt took his Metoprolol and ASA this AM.  Pt did not have any devices to check vitals as he is at work.  Pt states he's ok, it just concerned him because this is the first time it has happened in awhile.  Advised pt to continue to monitor and let us know if he begins to have episodes of Afib more frequently.  Pt currently scheduled for yearly OV on 2/25.  Advised I will send message to Dr. Katrinka BlazingSmith to see if he has further recommendations or feels pt needs sooner appt, otherwise just plan to see us in Feb.  Pt verbalized understanding and was appreciative for call.

## 2017-03-19 NOTE — Telephone Encounter (Signed)
Agree with recommendation

## 2017-03-19 NOTE — Telephone Encounter (Signed)
New Message   Patient c/o Palpitations:  High priority if patient c/o lightheadedness, shortness of breath, or chest pain  1) How long have you had palpitations/irregular HR/ Afib? Are you having the symptoms now?   2) Are you currently experiencing lightheadedness, SOB or CP? no  Do you have a history of afib (atrial fibrillation) or irregular heart rhythm? Yes  3) Have you checked your BP or HR? (document readings if available): no  4) Are you experiencing any other symptoms? **no   Patient states that he experienced afib today. But he has not experienced it but only once. Please call

## 2017-04-07 ENCOUNTER — Telehealth: Payer: Self-pay | Admitting: Interventional Cardiology

## 2017-04-07 ENCOUNTER — Ambulatory Visit (INDEPENDENT_AMBULATORY_CARE_PROVIDER_SITE_OTHER): Payer: BLUE CROSS/BLUE SHIELD | Admitting: Cardiology

## 2017-04-07 ENCOUNTER — Encounter: Payer: Self-pay | Admitting: *Deleted

## 2017-04-07 ENCOUNTER — Encounter: Payer: Self-pay | Admitting: Cardiology

## 2017-04-07 VITALS — BP 128/70 | HR 78 | Resp 16 | Ht 72.0 in | Wt 223.0 lb

## 2017-04-07 DIAGNOSIS — J069 Acute upper respiratory infection, unspecified: Secondary | ICD-10-CM

## 2017-04-07 DIAGNOSIS — R000 Tachycardia, unspecified: Secondary | ICD-10-CM | POA: Diagnosis not present

## 2017-04-07 DIAGNOSIS — I480 Paroxysmal atrial fibrillation: Secondary | ICD-10-CM

## 2017-04-07 DIAGNOSIS — Z720 Tobacco use: Secondary | ICD-10-CM

## 2017-04-07 LAB — BASIC METABOLIC PANEL
BUN / CREAT RATIO: 14 (ref 9–20)
BUN: 11 mg/dL (ref 6–24)
CHLORIDE: 100 mmol/L (ref 96–106)
CO2: 23 mmol/L (ref 20–29)
Calcium: 9.6 mg/dL (ref 8.7–10.2)
Creatinine, Ser: 0.8 mg/dL (ref 0.76–1.27)
GFR calc non Af Amer: 100 mL/min/{1.73_m2} (ref >59)
GFR, EST AFRICAN AMERICAN: 115 mL/min/{1.73_m2} (ref >59)
GLUCOSE: 106 mg/dL — AB (ref 65–99)
Potassium: 4 mmol/L (ref 3.5–5.2)
SODIUM: 140 mmol/L (ref 134–144)

## 2017-04-07 LAB — TSH: TSH: 1.73 u[IU]/mL (ref 0.450–4.500)

## 2017-04-07 LAB — MAGNESIUM: MAGNESIUM: 2.1 mg/dL (ref 1.6–2.3)

## 2017-04-07 MED ORDER — METOPROLOL SUCCINATE ER 50 MG PO TB24: 50.0000 mg | ORAL_TABLET | Freq: Every day | ORAL | 3 refills | Status: DC

## 2017-04-07 NOTE — Patient Instructions (Addendum)
Medication Instructions:  1. INCREASE METOPROLOL SUCCINATE TO 50 MG DAILY; NEW RX HAS BEEN SENT IN  2. TAKE OTC MUCINEX 600 MG TAKE 1-2 TABS TWICE DAILY FOR 1 WEEK THEN STOP  3. TRY FLONASE NASAL SPRAY; 1 SPRAY INTO EACH NOSTRIL TWICE DAILY  4. MAKE SURE TO NOT USE ANY PSEUDONEPHRINE   Labwork: TODAY TSH, BMET, MAGNESIUM LEVEL  Testing/Procedures: NONE ORDERED TODAY  Follow-Up: KEEP YOUR UPCOMING APPT WITH DR. Katrinka BlazingSMITH ON 05/05/17  Any Other Special Instructions Will Be Listed Below (If Applicable).  IF YOUR HEART CONTINUES TO BE ELEVATED PLEASE CALL THE OFFICE TO LET Nada BoozerLAURA INGOLD, NP KNOW SO THAT WE MAY ORDER A HEART MONITOR   If you need a refill on your cardiac medications before your next appointment, please call your pharmacy.

## 2017-04-07 NOTE — Telephone Encounter (Signed)
Pt states he was told to call if he felt like he was constantly going in and out of Afib  Pt states he has been feeling like this for a couple of weeks now.  States he sometimes feels a fluttering and other times feels like a rapid heart rate.  Sometimes it lasts a few mins and other times can last an hour or more.  Scheduled pt to come in and be seen by Nada BoozerLaura Ingold, NP today.  Pt verbalized understanding and was in agreement with this plan.

## 2017-04-07 NOTE — Telephone Encounter (Signed)
Patient calling,  Patient states that "I need to come in as soon as possible." Patient states that he will go more in depth when he speaks with you.

## 2017-04-07 NOTE — Progress Notes (Signed)
Cardiology Office Note   Date:  04/07/2017   ID:  Dale Dalton, DOB 06-Apr-1960, MRN [id number]  PCP:  Patient, No Pcp Per  Cardiologist:  Dr. Katrinka Blazing     Chief Complaint  Patient presents with  . Atrial Fibrillation    tachycardia      History of Present Illness: Dale Dalton is a 57 y.o. male who presents for in and out of a fib.  He was last seen by Dr. Katrinka Blazing 05/03/17.  He has a hx of an episode of a fib in 12/2014   Echo then 2016 was 55-60%mild MR, RA mildly dilated. Monitor with no a fib.   Today he called in due to irregular HR and tachycardia.  He has several episodes some days and none other days.  He felt he had this with EKG but was in SR at 78.  No chest pain.   occ lightheadedness with rapid HR.  Has been taking pseudoephedrine.    Has URI.  He has freq allergy symptoms.  We discussed meds he could take.  He also needs a new crown.  Asked to hold off until this has imrproved.      Past Medical History:  Diagnosis Date  . Allergic rhinitis   . 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 History of echocardiogram    Echo 10/16:EF 55-60%, normal wall motion, mild MR, mild RAE  . PAF (paroxysmal atrial fibrillation) (HCC)    CHADS2-VASc=0    History reviewed. No pertinent surgical history.   Current Outpatient Medications  Medication Sig Dispense Refill  . aspirin EC 81 MG tablet Take 1 tablet (81 mg total) by mouth daily.    . metoprolol succinate (TOPROL-XL) 50 MG 24 hr tablet Take 1 tablet (50 mg total) by mouth daily. Take with or immediately following a meal. 90 tablet 3   No current facility-administered medications for this visit.     Allergies:   Novocain [procaine] and Peanut-containing drug products    Social History:  The patient  reports that he has been smoking cigarettes.  He has a 0.35 pack-year smoking history. he has never used smokeless tobacco. He reports that he drinks about 2.4 oz of alcohol per week. He reports that he does not use drugs.    Family History:  The patient's family history includes Cancer in his father and mother; Heart Problems in his brother.    ROS:  General:+ colds or fevers, no weight changes Skin:no rashes or ulcers HEENT:no blurred vision, no congestion CV:see HPI PUL:see HPI GI:no diarrhea constipation or melena, no indigestion GU:no hematuria, no dysuria MS:no joint pain, no claudication Neuro:no syncope, no lightheadedness Endo:no diabetes, no thyroid disease  Wt Readings from Last 3 Encounters:  04/07/17 223 lb (101.2 kg)  05/03/16 218 lb 12.8 oz (99.2 kg)  05/02/15 216 lb 12.8 oz (98.3 kg)     PHYSICAL EXAM: VS:  BP 128/70   Pulse 78   Resp 16   Ht 6' (1.829 m)   Wt 223 lb (101.2 kg)   SpO2 97%   BMI 30.24 kg/m  , BMI Body mass index is 30.24 kg/m. General:Pleasant affect, NAD Skin:Warm and dry, brisk capillary refill HEENT:normocephalic, sclera clear, mucus membranes moist, nasal congestion Neck:supple, no JVD, no bruits  Heart:S1S2 RRR without murmur, gallup, rub or click Lungs:clear without rales, rhonchi, or wheezes WUJ:WJXB, non tender, + BS, do not palpate liver spleen or masses Ext:no lower ext edema, 2+ pedal pulses, 2+ radial pulses  Neuro:alert and oriented, MAE, follows commands, + facial symmetry    EKG:  EKG is ordered today. The ekg ordered today demonstrates SR with LVH, no changes in T wave inversion III.    Recent Labs: No results found for requested labs within last 8760 hours.    Lipid Panel No results found for: CHOL, TRIG, HDL, CHOLHDL, VLDL, LDLCALC, LDLDIRECT     Other studies Reviewed: Additional studies/ records that were reviewed today include: .  Echo Study Conclusions  - Left ventricle: The cavity size was normal. Wall thickness was   normal. Systolic function was normal. The estimated ejection   fraction was in the range of 55% to 60%. Wall motion was normal;   there were no regional wall motion abnormalities. Left   ventricular  diastolic function parameters were normal. - Mitral valve: There was mild regurgitation. - Right atrium: The atrium was mildly dilated.  ASSESSMENT AND PLAN:  1.  Palpitations/tachycardia more freq with this URI, he has been taking pseudoephedrine - He thought his heart was racing when EKG was done -HR 78 but SR.  No further pseudoephedrine plain mucinex  With plenty of water.  flonase  Spray as well.  --we discussed monitor but he goes 2 days without symptoms at times.  His CHA2DS2VASC is 0  I will increase the toprol XL to 50 mg --he will take at bedtime.  If he continues with episodes will add event monitor.  --he will call us.  He will keep follow up with Dr. Katrinka BlazingSmith in Feb.    2.  URI this may be precipitation tachycardia - if no improvement he should see PCP  3.  Tobacco Use -down to one cigarette per day.  He will stop in future.    4.  Needs dental work, crown which he can have but would better to hold off until cold and tachycardia episodes improved.  But stable for dental work.   .     Current medicines are reviewed with the patient today.  The patient Has no concerns regarding medicines.  The following changes have been made:  See above Labs/ tests ordered today include:see above  Disposition:   FU:  see above  Signed, Nada BoozerLaura Donnajean Chesnut, NP  04/07/2017 12:18 PM    [facility] [address], [place], [place]  [address]/ [address] [place], [place] Phone: [phone]; Fax: [phone]  [phone]

## 2017-04-09 ENCOUNTER — Telehealth: Payer: Self-pay | Admitting: Cardiology

## 2017-04-09 NOTE — Telephone Encounter (Signed)
-----   Message from Leone BrandLaura R Ingold, NP sent at 04/07/2017  4:53 PM EST ----- Results normal.

## 2017-04-09 NOTE — Telephone Encounter (Signed)
Returned pts call and discussed his lab results. See result note. 

## 2017-04-09 NOTE — Telephone Encounter (Signed)
Follow Up: ° ° ° ° ° °Returning your call from yesterday, concerning his lab results. °

## 2017-04-28 ENCOUNTER — Encounter: Payer: Self-pay | Admitting: Interventional Cardiology

## 2017-05-05 ENCOUNTER — Ambulatory Visit: Payer: BLUE CROSS/BLUE SHIELD | Admitting: Interventional Cardiology

## 2017-05-19 NOTE — Progress Notes (Signed)
Cardiology Office Note   Date:  05/20/2017   ID:  Dale Dalton, DOB 08/29/1960, MRN [id number]  PCP:  Patient, No Pcp Per  Cardiologist:  Dr. Katrinka BlazingSmith    Chief Complaint  Patient presents with  . Tachycardia      History of Present Illness: Dale Dalton is a 57 y.o. male who presents for follow up of tachycardia  He has a hx of in and out of a fib.  He was last seen by Dr. Katrinka BlazingSmith 05/03/17.  He has a hx of an episode of a fib in 12/2014   Echo then 2016 was 55-60%mild MR, RA mildly dilated. Monitor with no a fib.   In Jan 2019 he called in due to irregular HR and tachycardia.  He has several episodes some days and none other days.  He felt he had this with EKG but was in SR at 78.  No chest pain.   Occ lightheadedness with rapid HR.  Has been taking pseudoephedrine.    Has URI.  He has freq allergy symptoms.  We discussed meds he could take.  He also needs a new crown.  Asked to hold off until this has imrproved.    His CHA2DS2VASC is 0  I will increase the toprol XL to 50 mg --he will take at bedtime.    Today he is doing well on the higher dose of toprol.  HR stable. BP stable.  His last labs were normal with mg+ of 2.1.  He may have had one episode that was rapid when he first woke up since I last saw.  We discussed event monitor but he is fine without, he is feeling better.  He has stopped fish oil due to fullness-gas he has when taking, he has no PCP so we will check lipids and hepatic today non fasting.  He had cake and OJ for BK.  He continues with allergies.   He wanted to take Mg+ but his level is stable.  Suggested not to take more than twice a week.      Past Medical History:  Diagnosis Date  . Allergic rhinitis   . 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 History of echocardiogram    Echo 10/16:EF 55-60%, normal wall motion, mild MR, mild RAE  . PAF (paroxysmal atrial fibrillation) (HCC)    CHADS2-VASc=0    History reviewed. No pertinent surgical history.   Current  Outpatient Medications  Medication Sig Dispense Refill  . aspirin EC 81 MG tablet Take 1 tablet (81 mg total) by mouth daily.    . metoprolol succinate (TOPROL-XL) 50 MG 24 hr tablet Take 1 tablet (50 mg total) by mouth daily. Take with or immediately following a meal. 90 tablet 3   No current facility-administered medications for this visit.     Allergies:   Novocain [procaine] and Peanut-containing drug products    Social History:  The patient  reports that he has been smoking cigarettes.  He has a 0.35 pack-year smoking history. he has never used smokeless tobacco. He reports that he drinks about 2.4 oz of alcohol per week. He reports that he does not use drugs.   Family History:  The patient's family history includes Cancer in his father and mother; Heart Problems in his brother.    ROS:  General:no colds or fevers + allergies, + weight changes increase  Skin:no rashes or ulcers HEENT:no blurred vision, no congestion CV:see HPI PUL:see HPI GI:no diarrhea constipation or melena, no  indigestion GU:no hematuria, no dysuria MS:no joint pain, no claudication Neuro:no syncope, no lightheadedness Endo:no diabetes, no thyroid disease  Wt Readings from Last 3 Encounters:  05/20/17 225 lb (102.1 kg)  04/07/17 223 lb (101.2 kg)  05/03/16 218 lb 12.8 oz (99.2 kg)     PHYSICAL EXAM: VS:  BP 132/64   Pulse 62   Ht 6' (1.829 m)   Wt 225 lb (102.1 kg)   SpO2 98%   BMI 30.52 kg/m  , BMI Body mass index is 30.52 kg/m. General:Pleasant affect, NAD Skin:Warm and dry, brisk capillary refill HEENT:normocephalic, sclera clear, mucus membranes moist Neck:supple, no JVD, no bruits  Heart:S1S2 RRR without murmur, gallup, rub or click- no premature beats, sounds SR Lungs:clear without rales, rhonchi, or wheezes ZOX:WRUE, non tender, + BS, do not palpate liver spleen or masses Ext:no lower ext edema, 2+ pedal pulses, 2+ radial pulses Neuro:alert and oriented X 3, MAE, follows commands, +  facial symmetry    EKG:  EKG is NOT ordered today.    Recent Labs: 04/07/2017: BUN 11; Creatinine, Ser 0.80; Magnesium 2.1; Potassium 4.0; Sodium 140; TSH 1.730    Lipid Panel No results found for: CHOL, TRIG, HDL, CHOLHDL, VLDL, LDLCALC, LDLDIRECT     Other studies Reviewed: Additional studies/ records that were reviewed today include:  Echo 10/120/16 .Study Conclusions  - Left ventricle: The cavity size was normal. Wall thickness was   normal. Systolic function was normal. The estimated ejection   fraction was in the range of 55% to 60%. Wall motion was normal;   there were no regional wall motion abnormalities. Left   ventricular diastolic function parameters were normal. - Mitral valve: There was mild regurgitation. - Right atrium: The atrium was mildly dilated.   ASSESSMENT AND PLAN:  1.  Palpitation/tachycardia CHA2DS2VASC is 0 --I increased his toprol and he has had only one episode since - we will hold off event monitor.  He is cleared for crown, but to tell dentist he has rapid HR.  Ok for travel in the summer.    2.  UIR may be allergies. But improved no pseudoephedrine.   3.   Tobacco use decreasing.     Current medicines are reviewed with the patient today.  The patient Has no concerns regarding medicines.  The following changes have been made:  See above Labs/ tests ordered today include:see above  Disposition:   FU:  see above  Signed, Nada Boozer, NP  05/20/2017 8:36 AM    [facility] [address] [place], [place], [place]  [address]/ [address] [place], [place] Phone: [phone]; Fax: [phone]  [phone]

## 2017-05-20 ENCOUNTER — Encounter: Payer: Self-pay | Admitting: Cardiology

## 2017-05-20 ENCOUNTER — Ambulatory Visit (INDEPENDENT_AMBULATORY_CARE_PROVIDER_SITE_OTHER): Payer: BLUE CROSS/BLUE SHIELD | Admitting: Cardiology

## 2017-05-20 VITALS — BP 132/64 | HR 62 | Ht 72.0 in | Wt 225.0 lb

## 2017-05-20 DIAGNOSIS — R000 Tachycardia, unspecified: Secondary | ICD-10-CM | POA: Diagnosis not present

## 2017-05-20 DIAGNOSIS — I491 Atrial premature depolarization: Secondary | ICD-10-CM

## 2017-05-20 DIAGNOSIS — Z720 Tobacco use: Secondary | ICD-10-CM | POA: Diagnosis not present

## 2017-05-20 DIAGNOSIS — J069 Acute upper respiratory infection, unspecified: Secondary | ICD-10-CM | POA: Diagnosis not present

## 2017-05-20 LAB — HEPATIC FUNCTION PANEL
ALBUMIN: 4.5 g/dL (ref 3.5–5.5)
ALT: 15 IU/L (ref 0–44)
AST: 13 IU/L (ref 0–40)
Alkaline Phosphatase: 52 IU/L (ref 39–117)
BILIRUBIN TOTAL: 0.8 mg/dL (ref 0.0–1.2)
BILIRUBIN, DIRECT: 0.21 mg/dL (ref 0.00–0.40)
TOTAL PROTEIN: 6.6 g/dL (ref 6.0–8.5)

## 2017-05-20 LAB — LIPID PANEL
CHOL/HDL RATIO: 3.2 ratio (ref 0.0–5.0)
Cholesterol, Total: 144 mg/dL (ref 100–199)
HDL: 45 mg/dL (ref >39)
LDL CALC: 74 mg/dL (ref 0–99)
TRIGLYCERIDES: 126 mg/dL (ref 0–149)
VLDL Cholesterol Cal: 25 mg/dL (ref 5–40)

## 2017-05-20 NOTE — Patient Instructions (Signed)
Medication Instructions: Your physician recommends that you continue on your current medications as directed. Please refer to the Current Medication list given to you today.  Labwork: Your physician has recommended that you have lab work today: Lipid and Hepatic Function Panel  Procedures/Testing: None Ordered  Follow-Up: Your physician wants you to follow-up in: 1 YEAR with Dr. Katrinka BlazingSmith.  You will receive a reminder letter in the mail two months in advance. If you don't receive a letter, please call our office to schedule the follow-up appointment.   If you need a refill on your cardiac medications before your next appointment, please call your pharmacy.

## 2017-05-22 ENCOUNTER — Telehealth: Payer: Self-pay | Admitting: Cardiology

## 2017-05-22 NOTE — Telephone Encounter (Signed)
NEW MESSAGE     Patient calling for lab results

## 2017-05-22 NOTE — Telephone Encounter (Signed)
Informed patient of lab results and recommendations per L. Ingold.Dale Dalton. He verbalized understanding..Dale Dalton

## 2017-05-27 ENCOUNTER — Encounter: Payer: Self-pay | Admitting: *Deleted

## 2017-05-28 ENCOUNTER — Telehealth: Payer: Self-pay | Admitting: *Deleted

## 2017-05-28 NOTE — Telephone Encounter (Signed)
Pt returned my call for his lab result. He advised me that he called last week that he had called and spoke with Sigurd SosMichael Dapp, RN, and received his results last week. I apologized to pt and advised him that I also had mailed a letter, since I had tried 3 times to reach him, and that he can just disregard that.  Pt verbalized understanding.

## 2018-03-05 ENCOUNTER — Other Ambulatory Visit: Payer: Self-pay | Admitting: Cardiology

## 2018-05-21 ENCOUNTER — Ambulatory Visit: Payer: BLUE CROSS/BLUE SHIELD | Admitting: Interventional Cardiology

## 2018-05-21 ENCOUNTER — Encounter: Payer: Self-pay | Admitting: Cardiology

## 2018-05-27 ENCOUNTER — Telehealth: Payer: Self-pay

## 2018-05-27 NOTE — Telephone Encounter (Addendum)
Left message for patient to call back to discuss about appointment with Nada Boozer, NP on 06/02/18 to see how pt is doing and whether pt needs to be seen, due to COVID19 precautions.

## 2018-06-02 ENCOUNTER — Ambulatory Visit: Payer: BLUE CROSS/BLUE SHIELD | Admitting: Cardiology

## 2018-06-08 ENCOUNTER — Telehealth: Payer: Self-pay | Admitting: *Deleted

## 2018-06-08 NOTE — Telephone Encounter (Signed)
Called pt re: appt with Nada Boozer, NP, 06/18/18. Wanted to set pt up for virtual video/phone. Left pt a message to call back.

## 2018-06-08 NOTE — Telephone Encounter (Signed)
-----   Message from Leone Brand, NP sent at 06/08/2018  9:15 AM EDT ----- Needs virtual visit on the 16th of April.  Thanks.

## 2018-06-08 NOTE — Telephone Encounter (Signed)
Pt returned my call and he doesn't have access to a computer with a web cam nor a smart phone. Will set pt up as a virtual / phone visiit.

## 2018-06-24 NOTE — Progress Notes (Signed)
Virtual Visit via Telephone Note   This visit type was conducted due to national recommendations for restrictions regarding the COVID-19 Pandemic (e.g. social distancing) in an effort to limit this patient's exposure and mitigate transmission in our community.  Due to his co-morbid illnesses, this patient is at least at moderate risk for complications without adequate follow up.  This format is felt to be most appropriate for this patient at this time.  The patient did not have access to video technology/had technical difficulties with video requiring transitioning to audio format only (telephone).  All issues noted in this document were discussed and addressed.  No physical exam could be performed with this format.  Please refer to the patient's chart for his  consent to telehealth for [facility].   Evaluation Performed:  Follow-up visit  Date:  06/25/2018   ID:  Dale Dalton, DOB 10/17/1960, MRN [id number]  Patient Location: Home Provider Location: Office  PCP:  Patient, No Pcp Per  Cardiologist:  Lesleigh NoeHenry W Smith III, MD  Electrophysiologist:  None   Chief Complaint:  Tachycardia   History of Present Illness:    Dale Dalton is a 58 y.o. male with tachycardia  He has a hx of in and out of a fib.He was last seen by Dr. Katrinka BlazingSmith 05/03/17.  He has a hx of an episode of a fib in 12/2014 Echo then 2016 was 55-60%mild MR, RA mildly dilated. Monitor with no a fib.   In Jan 2019 he called in due to irregular HRand tachycardia. He has several episodes some days and none other days. He felt he had this with EKG but was in SR at 78. No chest pain. Occ lightheadedness with rapid HR. Has been taking pseudoephedrine. Has URI. He has freq allergy symptoms. We discussed meds he could take. He also needs a new crown. Asked to hold off until this has imrproved. His CHA2DS2VASC is 0 Dr. Katrinka BlazingSmith increased the toprol XL to 50 mg --he takes at bedtime.    On follow up he was doing  well on the higher dose of toprol.  HR stable. BP stable.  His last labs were normal with mg+ of 2.1.  He may have had one episode that was rapid when he first woke up since he had been seen.  We discussed event monitor but he is fine without, he is feeling better.  He has stopped fish oil due to fullness-gas he has when taking, he has no PCP so we will check lipids and hepatic today non fasting.  He had cake and OJ for BK.  He continues with allergies. Last lipids a year ago with LDL of 74.  He wanted to take Mg+ but his level is stable.  Suggested not to take more than twice a week.    Today He has had his usual allergy symptoms with some congestion.  No fever, he does check it.  He continues to work at [employer] and walks at least 3 miles per day with job.  He has brief episodes of palpitations lasting 1-2 seconds. No chest pain and only SOB is with his allergies.  He continues with mucinex  and flonase for allergies.  Reminded to take claritin if needed just NO pseudoephedrine.   He continues to smoke but only 6-7 cigarettes per day.  Down from over a PPD.  He would like to quit but just not now.    He is wearing mask with shopping, though he finds it difficult  to wear at work due to heat.  He is washing his hands freq.  He understands he is at higher risk with a fib.  His diet is healthy.   We also discussed sleep apnea but he does not snore often.      The patient does not have symptoms concerning for COVID-19 infection (fever, chills, cough, or new shortness of breath).    Past Medical History:  Diagnosis Date  . Allergic rhinitis   . 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 History of echocardiogram    Echo 10/16:EF 55-60%, normal wall motion, mild MR, mild RAE  . PAF (paroxysmal atrial fibrillation) (HCC)    CHADS2-VASc=0   No past surgical history on file.   Current Meds  Medication Sig  . aspirin EC 81 MG tablet Take 1 tablet (81 mg total) by mouth daily.  . sodium chloride (OCEAN)  0.65 % SOLN nasal spray Place 2 sprays into both nostrils as needed (seasonal allergies).     Allergies:   Novocain [procaine] and Peanut-containing drug products   Social History   Tobacco Use  . Smoking status: Light Tobacco Smoker    Packs/day: 0.01    Years: 35.00    Pack years: 0.35    Types: Cigarettes  . Smokeless tobacco: Never Used  Substance Use Topics  . Alcohol use: Yes    Alcohol/week: 4.0 standard drinks    Types: 4 Cans of beer per week  . Drug use: No     Family Hx: The patient's family history includes Cancer in his father and mother; Heart Problems in his brother.  ROS:   Please see the history of present illness.    General:no colds or fevers,+ allergies  no weight changes Skin:no rashes or ulcers HEENT:no blurred vision, no congestion CV:see HPI PUL:see HPI GI:no diarrhea constipation or melena, no indigestion GU:no hematuria, no dysuria MS:no joint pain, no claudication Neuro:no syncope, no lightheadedness Endo:no diabetes, no thyroid disease' All other systems reviewed and are negative.   Prior CV studies:   The following studies were reviewed today:  Echo 2016 Study Conclusions  - Left ventricle: The cavity size was normal. Wall thickness was   normal. Systolic function was normal. The estimated ejection   fraction was in the range of 55% to 60%. Wall motion was normal;   there were no regional wall motion abnormalities. Left   ventricular diastolic function parameters were normal. - Mitral valve: There was mild regurgitation. - Right atrium: The atrium was mildly dilated  Labs/Other Tests and Data Reviewed:    EKG:  An ECG dated 04/07/17 was personally reviewed today and demonstrated:  SR with LVH  Recent Labs: No results found for requested labs within last 8760 hours.   Recent Lipid Panel Lab Results  Component Value Date/Time   CHOL 144 05/20/2017 08:37 AM   TRIG 126 05/20/2017 08:37 AM   HDL 45 05/20/2017 08:37 AM   CHOLHDL  3.2 05/20/2017 08:37 AM   LDLCALC 74 05/20/2017 08:37 AM    Wt Readings from Last 3 Encounters:  06/25/18 212 lb 3.2 oz (96.3 kg)  05/20/17 225 lb (102.1 kg)  04/07/17 223 lb (101.2 kg)     Objective:    Vital Signs:  BP 114/65   Pulse (!) 55   Temp (!) 95.7 F (35.4 C)   Ht 6' (1.829 m)   Wt 212 lb 3.2 oz (96.3 kg)   BMI 28.78 kg/m    Male sounded no  acute distress by telephone and was able to talk in complete sentences without SOB.  Pleasant affect.  Oriented X 3.   ASSESSMENT & PLAN:    1. PAF very rare episodes the toprol XL 50 mg is doing well, does make him drowsy and he does take at night.   Will plan for EKG in 3 months and labs at that time. Lipids and CMP  Follow up with Dr. Katrinka Blazing in 12 months. 2. Tobacco use, discussed importance of stopping.    COVID-19 Education: The signs and symptoms of COVID-19 were discussed with the patient and how to seek care for testing (follow up with PCP or arrange E-visit).  The importance of social distancing was discussed today.  Time:   Today, I have spent 20 minutes with the patient with telehealth technology discussing the above problems.     Medication Adjustments/Labs and Tests Ordered: Current medicines are reviewed at length with the patient today.  Concerns regarding medicines are outlined above.   Tests Ordered: No orders of the defined types were placed in this encounter.   Medication Changes: No orders of the defined types were placed in this encounter.   Disposition:  Follow up in 12 months with Dr. Katrinka Blazing or earlier if problems.   Signed, Nada Boozer, NP  06/25/2018 8:14 AM    [facility]

## 2018-06-25 ENCOUNTER — Telehealth (INDEPENDENT_AMBULATORY_CARE_PROVIDER_SITE_OTHER): Payer: BLUE CROSS/BLUE SHIELD | Admitting: Cardiology

## 2018-06-25 ENCOUNTER — Encounter: Payer: Self-pay | Admitting: Cardiology

## 2018-06-25 ENCOUNTER — Other Ambulatory Visit: Payer: Self-pay

## 2018-06-25 ENCOUNTER — Encounter: Payer: Self-pay | Admitting: *Deleted

## 2018-06-25 ENCOUNTER — Telehealth: Payer: Self-pay | Admitting: Cardiology

## 2018-06-25 ENCOUNTER — Telehealth: Payer: Self-pay | Admitting: *Deleted

## 2018-06-25 VITALS — BP 114/65 | HR 55 | Temp 95.7°F | Ht 72.0 in | Wt 212.2 lb

## 2018-06-25 DIAGNOSIS — Z720 Tobacco use: Secondary | ICD-10-CM

## 2018-06-25 DIAGNOSIS — I480 Paroxysmal atrial fibrillation: Secondary | ICD-10-CM

## 2018-06-25 MED ORDER — METOPROLOL SUCCINATE ER 50 MG PO TB24: 50.0000 mg | ORAL_TABLET | Freq: Every day | ORAL | 1 refills | Status: DC

## 2018-06-25 NOTE — Telephone Encounter (Signed)
SPOKE WITH PT TO CONFIRM AUG APPT LABS AND EKG AND RX SENT IN 90 DAY I REFILL

## 2018-06-25 NOTE — Telephone Encounter (Signed)
New Message  Patient waiting on your call, to wrap up his appointment.

## 2018-10-19 ENCOUNTER — Other Ambulatory Visit: Payer: Self-pay | Admitting: Cardiology

## 2018-10-19 ENCOUNTER — Other Ambulatory Visit: Payer: Self-pay

## 2018-10-19 ENCOUNTER — Ambulatory Visit (INDEPENDENT_AMBULATORY_CARE_PROVIDER_SITE_OTHER): Payer: BC Managed Care – PPO

## 2018-10-19 ENCOUNTER — Other Ambulatory Visit: Payer: BLUE CROSS/BLUE SHIELD | Admitting: *Deleted

## 2018-10-19 VITALS — BP 114/62 | HR 58 | Ht 72.0 in | Wt 220.8 lb

## 2018-10-19 DIAGNOSIS — R000 Tachycardia, unspecified: Secondary | ICD-10-CM | POA: Diagnosis not present

## 2018-10-19 DIAGNOSIS — I480 Paroxysmal atrial fibrillation: Secondary | ICD-10-CM

## 2018-10-19 LAB — COMPREHENSIVE METABOLIC PANEL
ALT: 13 IU/L (ref 0–44)
AST: 16 IU/L (ref 0–40)
Albumin/Globulin Ratio: 2.3 — ABNORMAL HIGH (ref 1.2–2.2)
Albumin: 4.6 g/dL (ref 3.8–4.9)
Alkaline Phosphatase: 50 IU/L (ref 39–117)
BUN/Creatinine Ratio: 12 (ref 9–20)
BUN: 11 mg/dL (ref 6–24)
Bilirubin Total: 0.6 mg/dL (ref 0.0–1.2)
CO2: 20 mmol/L (ref 20–29)
Calcium: 9.3 mg/dL (ref 8.7–10.2)
Chloride: 106 mmol/L (ref 96–106)
Creatinine, Ser: 0.89 mg/dL (ref 0.76–1.27)
GFR calc Af Amer: 109 mL/min/{1.73_m2} (ref >59)
GFR calc non Af Amer: 94 mL/min/{1.73_m2} (ref >59)
Globulin, Total: 2 g/dL (ref 1.5–4.5)
Glucose: 100 mg/dL — ABNORMAL HIGH (ref 65–99)
Potassium: 4.3 mmol/L (ref 3.5–5.2)
Sodium: 142 mmol/L (ref 134–144)
Total Protein: 6.6 g/dL (ref 6.0–8.5)

## 2018-10-19 LAB — LIPID PANEL
Chol/HDL Ratio: 3.1 ratio (ref 0.0–5.0)
Cholesterol, Total: 142 mg/dL (ref 100–199)
HDL: 46 mg/dL (ref >39)
LDL Calculated: 80 mg/dL (ref 0–99)
Triglycerides: 79 mg/dL (ref 0–149)
VLDL Cholesterol Cal: 16 mg/dL (ref 5–40)

## 2018-10-19 NOTE — Progress Notes (Signed)
Pt came in for EKG and lab work. Dr. Irish Lack signed off on his EKG which showed sinus bradycardia.

## 2018-10-21 ENCOUNTER — Telehealth: Payer: Self-pay | Admitting: Cardiology

## 2018-10-21 NOTE — Telephone Encounter (Signed)
New Message ° ° °Patient is returning call in reference to lab results.  °

## 2018-10-22 NOTE — Progress Notes (Signed)
Pt has been made aware of normal result and verbalized understanding.  jw 10/22/2018

## 2018-10-22 NOTE — Telephone Encounter (Signed)
Returned pt's call and he has been made aware of his lab results and verbalized understanding.

## 2018-10-22 NOTE — Telephone Encounter (Signed)
-----   Message from Isaiah Serge, NP sent at 10/20/2018  8:19 AM EDT ----- Labs normal.  Lipids with bad cholesterol of 80 and HDL at 46.   Great.

## 2018-10-22 NOTE — Telephone Encounter (Signed)
Follow up ° ° °Patient is returning call for lab results. Please call. °

## 2019-01-25 ENCOUNTER — Other Ambulatory Visit: Payer: Self-pay | Admitting: Cardiology

## 2019-07-07 NOTE — Progress Notes (Signed)
Cardiology Office Note:    Date:  07/08/2019   ID:  Dale Dalton, DOB April 25, 1960, MRN [id number]  PCP:  Patient, No Pcp Per  Cardiologist:  Lesleigh Noe, MD   Referring MD: No ref. provider found   Chief Complaint  Patient presents with  . Atrial Fibrillation    History of Present Illness:    Dale Dalton is a 59 y.o. male with a hx of of atrial fibrillation that included a single episode with rapid ventricular response.   He has an occasional 1 to 2-second episode of palpitation.  This happens once every 1 to 2 months.  No prolonged episodes.  Really no cardiovascular complaints at all.  His job at [employer] center is very active.  He works 8 to 12-hour shifts.  He is walking the entire time.  No chest discomfort, dyspnea, lower extremity swelling, or other complaints.  Has had no neurological complaints.  Past Medical History:  Diagnosis Date  . Allergic rhinitis   . 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 History of echocardiogram    Echo 10/16:EF 55-60%, normal wall motion, mild MR, mild RAE  . PAF (paroxysmal atrial fibrillation) (HCC)    CHADS2-VASc=0    History reviewed. No pertinent surgical history.  Current Medications: Current Meds  Medication Sig  . aspirin EC 81 MG tablet Take 1 tablet (81 mg total) by mouth daily.  . metoprolol succinate (TOPROL-XL) 50 MG 24 hr tablet TAKE 1 TABLET (50 MG TOTAL) BY MOUTH DAILY. TAKE WITH OR IMMEDIATELY FOLLOWING A MEAL.  . sodium chloride (OCEAN) 0.65 % SOLN nasal spray Place 2 sprays into both nostrils as needed (seasonal allergies).     Allergies:   Novocain [procaine] and Peanut-containing drug products   Social History   Socioeconomic History  . Marital status: Single    Spouse name: Not on file  . Number of children: Not on file  . Years of education: Not on file  . Highest education level: Not on file  Occupational History  . Not on file  Tobacco Use  . Smoking status: Light Tobacco Smoker   Packs/day: 0.01    Years: 35.00    Pack years: 0.35    Types: Cigarettes  . Smokeless tobacco: Never Used  Substance and Sexual Activity  . Alcohol use: Yes    Alcohol/week: 4.0 standard drinks    Types: 4 Cans of beer per week  . Drug use: No  . Sexual activity: Not on file  Other Topics Concern  . Not on file  Social History Narrative  . Not on file   Social Determinants of Health   Financial Resource Strain:   . Difficulty of Paying Living Expenses:   Food Insecurity:   . Worried About Programme researcher, broadcasting/film/video in the Last Year:   . Barista in the Last Year:   Transportation Needs:   . Freight forwarder (Medical):   Marland Kitchen Lack of Transportation (Non-Medical):   Physical Activity:   . Days of Exercise per Week:   . Minutes of Exercise per Session:   Stress:   . Feeling of Stress :   Social Connections:   . Frequency of Communication with Friends and Family:   . Frequency of Social Gatherings with Friends and Family:   . Attends Religious Services:   . Active Member of Clubs or Organizations:   . Attends Banker Meetings:   Marland Kitchen Marital Status:  Family History: The patient's family history includes Cancer in his father and mother; Heart Problems in his brother.  ROS:   Please see the history of present illness.    Denies recent alcohol intake.  He still smokes cigarettes intermittently.  He has chronic back pain L4-L5 due to degenerative disc disease.  All other systems reviewed and are negative.  EKGs/Labs/Other Studies Reviewed:    The following studies were reviewed today: No new cardiac  EKG:  EKG  performed on October 19, 2018 the cardia and was otherwise practically normal.  Recent Labs: 10/19/2018: ALT 13; BUN 11; Creatinine, Ser 0.89; Potassium 4.3; Sodium 142  Recent Lipid Panel    Component Value Date/Time   CHOL 142 10/19/2018 0805   TRIG 79 10/19/2018 0805   HDL 46 10/19/2018 0805   CHOLHDL 3.1 10/19/2018 0805   LDLCALC 80  10/19/2018 0805    Physical Exam:    VS:  BP 104/68   Pulse 64   Ht 6' (1.829 m)   Wt 219 lb 12.8 oz (99.7 kg)   SpO2 97%   BMI 29.81 kg/m     Wt Readings from Last 3 Encounters:  07/08/19 219 lb 12.8 oz (99.7 kg)  10/19/18 220 lb 12.8 oz (100.2 kg)  06/25/18 212 lb 3.2 oz (96.3 kg)     GEN: Moderate obesity. No acute distress HEENT: Normal NECK: No JVD. LYMPHATICS: No lymphadenopathy CARDIAC:  RRR without murmur, gallop, or edema. VASCULAR:  Normal Pulses. No bruits. RESPIRATORY:  Clear to auscultation without rales, wheezing or rhonchi  ABDOMEN: Soft, non-tender, non-distended, No pulsatile mass, MUSCULOSKELETAL: No deformity  SKIN: Warm and dry NEUROLOGIC:  Alert and oriented x 3 PSYCHIATRIC:  Normal affect   ASSESSMENT:    1. PAF (paroxysmal atrial fibrillation) ([place])   2. Tobacco use   3. Educated about COVID-19 virus infection    PLAN:    In order of problems listed above:  1. No recurrence of atrial fibrillation of any consequence.  Continue to monitor for any prolonged episode.  Abstain from alcohol. 2. Encouraged abstaining from tobacco. 3. Encourage the COVID-19 vaccine.  Social distancing wearing is still being observed at his workplace.   Medication Adjustments/Labs and Tests Ordered: Current medicines are reviewed at length with the patient today.  Concerns regarding medicines are outlined above.  No orders of the defined types were placed in this encounter.  No orders of the defined types were placed in this encounter.   Patient Instructions  Medication Instructions:  Your physician recommends that you continue on your current medications as directed. Please refer to the Current Medication list given to you today.  *If you need a refill on your cardiac medications before your next appointment, please call your pharmacy*   Lab Work: None If you have labs (blood work) drawn today and your tests are completely normal, you will receive your  results only by: Marland Kitchen MyChart Message (if you have MyChart) OR . A paper copy in the mail If you have any lab test that is abnormal or we need to change your treatment, we will call you to review the results.   Testing/Procedures: None   Follow-Up: At [facility], you and your health needs are our priority.  As part of our continuing mission to provide you with exceptional heart care, we have created designated Provider Care Teams.  These Care Teams include your primary Cardiologist (physician) and Advanced Practice Providers (APPs -  Physician Assistants and Nurse Practitioners) who all  work together to provide you with the care you need, when you need it.  We recommend signing up for the patient portal called "MyChart".  Sign up information is provided on this After Visit Summary.  MyChart is used to connect with patients for Virtual Visits (Telemedicine).  Patients are able to view lab/test results, encounter notes, upcoming appointments, etc.  Non-urgent messages can be sent to your provider as well.   To learn more about what you can do with MyChart, go to [email or web address].    Your next appointment:   12 month(s)  The format for your next appointment:   In Person  Provider:   You may see Lesleigh Noe, MD or one of the following Advanced Practice Providers on your designated Care Team:    Norma Fredrickson, NP  Nada Boozer, NP  Georgie Chard, NP    Other Instructions      Signed, Lesleigh Noe, MD  07/08/2019 8:52 AM    [facility]

## 2019-07-08 ENCOUNTER — Other Ambulatory Visit: Payer: Self-pay

## 2019-07-08 ENCOUNTER — Encounter: Payer: Self-pay | Admitting: *Deleted

## 2019-07-08 ENCOUNTER — Ambulatory Visit (INDEPENDENT_AMBULATORY_CARE_PROVIDER_SITE_OTHER): Payer: BC Managed Care – PPO | Admitting: Interventional Cardiology

## 2019-07-08 ENCOUNTER — Encounter: Payer: Self-pay | Admitting: Interventional Cardiology

## 2019-07-08 VITALS — BP 104/68 | HR 64 | Ht 72.0 in | Wt 219.8 lb

## 2019-07-08 DIAGNOSIS — I480 Paroxysmal atrial fibrillation: Secondary | ICD-10-CM | POA: Diagnosis not present

## 2019-07-08 DIAGNOSIS — Z7189 Other specified counseling: Secondary | ICD-10-CM | POA: Diagnosis not present

## 2019-07-08 DIAGNOSIS — Z720 Tobacco use: Secondary | ICD-10-CM | POA: Diagnosis not present

## 2019-07-08 MED ORDER — METOPROLOL SUCCINATE ER 50 MG PO TB24: 50.0000 mg | ORAL_TABLET | Freq: Every day | ORAL | 3 refills | Status: DC

## 2019-07-08 NOTE — Patient Instructions (Signed)

## 2020-06-26 NOTE — Progress Notes (Signed)
Cardiology Office Note    Date:  07/04/2020   ID:  Dale Dalton, DOB [birth year], MRN [id number]   PCP:  Patient, No Pcp Per (Inactive)   [facility]  Cardiologist:  Lesleigh Noe, MD  Advanced Practice Provider:  No care team member to display Electrophysiologist:  None   [id number]   Chief Complaint  Patient presents with  . Follow-up    History of Present Illness:  Dale Dalton is a 60 y.o. male with history of 1 episode of atrial fibrillation with RVR 2016 CHA2DS2-VASc equals 0 and no recurrence, tobacco abuse.  Patient last saw Dr. Katrinka Blazing 07/08/2019 at which time he had occasional palpitations but no prolonged episodes.  Patient had covid19 in January and thinks he was in Afib for about 5 min from heavy breathing and congestion. He says it may not have been Afib but heart was beating fast. Not vaccinated and is against it. Smokes 5-6 cigarettes/day. On his feet all day long at work walking. No chest pain, dyspnea, dizziness or presyncope.  Past Medical History:  Diagnosis Date  . Allergic rhinitis   . 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 History of echocardiogram    Echo 10/16:EF 55-60%, normal wall motion, mild MR, mild RAE  . PAF (paroxysmal atrial fibrillation) (HCC)    CHADS2-VASc=0    No past surgical history on file.  Current Medications: Current Meds  Medication Sig  . aspirin EC 81 MG tablet Take 1 tablet (81 mg total) by mouth daily.  . sodium chloride (OCEAN) 0.65 % SOLN nasal spray Place 2 sprays into both nostrils as needed (seasonal allergies).  . [DISCONTINUED] metoprolol succinate (TOPROL-XL) 50 MG 24 hr tablet Take 1 tablet (50 mg total) by mouth daily. Take with or immediately following a meal.     Allergies:   Novocain [procaine] and Peanut-containing drug products   Social History   Socioeconomic History  . Marital status: Single    Spouse name: Not on file  . Number of children: Not on file  . Years of  education: Not on file  . Highest education level: Not on file  Occupational History  . Not on file  Tobacco Use  . Smoking status: Light Tobacco Smoker    Packs/day: 0.01    Years: 35.00    Pack years: 0.35    Types: Cigarettes  . Smokeless tobacco: Never Used  Vaping Use  . Vaping Use: Never used  Substance and Sexual Activity  . Alcohol use: Yes    Alcohol/week: 4.0 standard drinks    Types: 4 Cans of beer per week  . Drug use: No  . Sexual activity: Not on file  Other Topics Concern  . Not on file  Social History Narrative  . Not on file   Social Determinants of Health   Financial Resource Strain: Not on file  Food Insecurity: Not on file  Transportation Needs: Not on file  Physical Activity: Not on file  Stress: Not on file  Social Connections: Not on file     Family History:  The patient's family history includes Cancer in his father and mother; Heart Problems in his brother.   ROS:   Please see the history of present illness.    ROS All other systems reviewed and are negative.   PHYSICAL EXAM:   VS:  BP 122/80   Pulse 67   Ht 6' (1.829 m)   Wt 218 lb 12.8 oz (99.2  kg)   SpO2 97%   BMI 29.67 kg/m   Physical Exam  GEN: Well nourished, well developed, in no acute distress  Neck: no JVD, carotid bruits, or masses Cardiac:RRR; no murmurs, rubs, or gallops  Respiratory:  clear to auscultation bilaterally, normal work of breathing GI: soft, nontender, nondistended, + BS Ext: without cyanosis, clubbing, or edema, Good distal pulses bilaterally Neuro:  Alert and Oriented x 3 Psych: euthymic mood, full affect  Wt Readings from Last 3 Encounters:  07/04/20 218 lb 12.8 oz (99.2 kg)  07/08/19 219 lb 12.8 oz (99.7 kg)  10/19/18 220 lb 12.8 oz (100.2 kg)      Studies/Labs Reviewed:   EKG:  EKG is  ordered today.  The ekg ordered today demonstrates NSR normal EKG  Recent Labs: No results found for requested labs within last 8760 hours.   Lipid Panel     Component Value Date/Time   CHOL 142 10/19/2018 0805   TRIG 79 10/19/2018 0805   HDL 46 10/19/2018 0805   CHOLHDL 3.1 10/19/2018 0805   LDLCALC 80 10/19/2018 0805    Additional studies/ records that were reviewed today include:  2D echo 12/19/2014 Study Conclusions   - Left ventricle: The cavity size was normal. Wall thickness was    normal. Systolic function was normal. The estimated ejection    fraction was in the range of 55% to 60%. Wall motion was normal;    there were no regional wall motion abnormalities. Left    ventricular diastolic function parameters were normal.  - Mitral valve: There was mild regurgitation.  - Right atrium: The atrium was mildly dilated.     Risk Assessment/Calculations:    CHA2DS2-VASc Score = 0  This indicates a 0.2% annual risk of stroke. The patient's score is based upon: CHF History: No HTN History: No Diabetes History: No Stroke History: No Vascular Disease History: No Age Score: 0 Gender Score: 0         ASSESSMENT:    1. PAF (paroxysmal atrial fibrillation) (HCC)   2. Tobacco abuse      PLAN:  In order of problems listed above:  PAF with RVR 1 episode in 2016 CHA2DS2-VASc score 0, may have had recurrence in Jan when he had covid19 and had a lot of congestion. Lasted less than 5 min. He says Toprol is working well.   Tobacco abuse-smoking cessation discussed.  Shared Decision Making/Informed Consent        Medication Adjustments/Labs and Tests Ordered: Current medicines are reviewed at length with the patient today.  Concerns regarding medicines are outlined above.  Medication changes, Labs and Tests ordered today are listed in the Patient Instructions below. Patient Instructions   Medication Instructions:  Your physician recommends that you continue on your current medications as directed. Please refer to the Current Medication list given to you today.  *If you need a refill on your cardiac medications before  your next appointment, please call your pharmacy*   Lab Work: Lipids, Cbc, Cmp, Tsh- Today   If you have labs (blood work) drawn today and your tests are completely normal, you will receive your results only by: Marland Kitchen MyChart Message (if you have MyChart) OR . A paper copy in the mail If you have any lab test that is abnormal or we need to change your treatment, we will call you to review the results.   Testing/Procedures: None ordered    Follow-Up: At [facility], you and your health needs are our priority.  As part of our continuing mission to provide you with exceptional heart care, we have created designated Provider Care Teams.  These Care Teams include your primary Cardiologist (physician) and Advanced Practice Providers (APPs -  Physician Assistants and Nurse Practitioners) who all work together to provide you with the care you need, when you need it.  We recommend signing up for the patient portal called "MyChart".  Sign up information is provided on this After Visit Summary.  MyChart is used to connect with patients for Virtual Visits (Telemedicine).  Patients are able to view lab/test results, encounter notes, upcoming appointments, etc.  Non-urgent messages can be sent to your provider as well.   To learn more about what you can do with MyChart, go to [email or web address].    Your next appointment:   12 month(s)  The format for your next appointment:   In Person  Provider:   You may see Lesleigh Noe, MD or one of the following Advanced Practice Providers on your designated Care Team:    Georgie Chard, NP    Other Instructions  Steps to Quit Smoking Smoking tobacco is the leading cause of preventable death. It can affect almost every organ in the body. Smoking puts you and people around you at risk for many serious, long-lasting (chronic) diseases. Quitting smoking can be hard, but it is one of the best things that you can do for your health. It is never too  late to quit. How do I get ready to quit? When you decide to quit smoking, make a plan to help you succeed. Before you quit:  Pick a date to quit. Set a date within the next 2 weeks to give you time to prepare.  Write down the reasons why you are quitting. Keep this list in places where you will see it often.  Tell your family, friends, and co-workers that you are quitting. Their support is important.  Talk with your doctor about the choices that may help you quit.  Find out if your health insurance will pay for these treatments.  Know the people, places, things, and activities that make you want to smoke (triggers). Avoid them. What first steps can I take to quit smoking?  Throw away all cigarettes at home, at work, and in your car.  Throw away the things that you use when you smoke, such as ashtrays and lighters.  Clean your car. Make sure to empty the ashtray.  Clean your home, including curtains and carpets. What can I do to help me quit smoking? Talk with your doctor about taking medicines and seeing a counselor at the same time. You are more likely to succeed when you do both.  If you are pregnant or breastfeeding, talk with your doctor about counseling or other ways to quit smoking. Do not take medicine to help you quit smoking unless your doctor tells you to do so. To quit smoking: Quit right away  Quit smoking totally, instead of slowly cutting back on how much you smoke over a period of time.  Go to counseling. You are more likely to quit if you go to counseling sessions regularly. Take medicine You may take medicines to help you quit. Some medicines need a prescription, and some you can buy over-the-counter. Some medicines may contain a drug called nicotine to replace the nicotine in cigarettes. Medicines may:  Help you to stop having the desire to smoke (cravings).  Help to stop the problems that come when you stop  smoking (withdrawal symptoms). Your doctor may ask  you to use:  Nicotine patches, gum, or lozenges.  Nicotine inhalers or sprays.  Non-nicotine medicine that is taken by mouth. Find resources Find resources and other ways to help you quit smoking and remain smoke-free after you quit. These resources are most helpful when you use them often. They include:  Online chats with a Veterinary surgeon.  Phone quitlines.  Printed Materials engineer.  Support groups or group counseling.  Text messaging programs.  Mobile phone apps. Use apps on your mobile phone or tablet that can help you stick to your quit plan. There are many free apps for mobile phones and tablets as well as websites. Examples include Quit Guide from the [employer] and smokefree.gov   What things can I do to make it easier to quit?  Talk to your family and friends. Ask them to support and encourage you.  Call a phone quitline (1-800-QUIT-NOW), reach out to support groups, or work with a Veterinary surgeon.  Ask people who smoke to not smoke around you.  Avoid places that make you want to smoke, such as: ? Bars. ? Parties. ? Smoke-break areas at work.  Spend time with people who do not smoke.  Lower the stress in your life. Stress can make you want to smoke. Try these things to help your stress: ? Getting regular exercise. ? Doing deep-breathing exercises. ? Doing yoga. ? Meditating. ? Doing a body scan. To do this, close your eyes, focus on one area of your body at a time from head to toe. Notice which parts of your body are tense. Try to relax the muscles in those areas.   How will I feel when I quit smoking? Day 1 to 3 weeks Within the first 24 hours, you may start to have some problems that come from quitting tobacco. These problems are very bad 2-3 days after you quit, but they do not often last for more than 2-3 weeks. You may get these symptoms:  Mood swings.  Feeling restless, nervous, angry, or annoyed.  Trouble concentrating.  Dizziness.  Strong desire for high-sugar  foods and nicotine.  Weight gain.  Trouble pooping (constipation).  Feeling like you may vomit (nausea).  Coughing or a sore throat.  Changes in how the medicines that you take for other issues work in your body.  Depression.  Trouble sleeping (insomnia). Week 3 and afterward After the first 2-3 weeks of quitting, you may start to notice more positive results, such as:  Better sense of smell and taste.  Less coughing and sore throat.  Slower heart rate.  Lower blood pressure.  Clearer skin.  Better breathing.  Fewer sick days. Quitting smoking can be hard. Do not give up if you fail the first time. Some people need to try a few times before they succeed. Do your best to stick to your quit plan, and talk with your doctor if you have any questions or concerns. Summary  Smoking tobacco is the leading cause of preventable death. Quitting smoking can be hard, but it is one of the best things that you can do for your health.  When you decide to quit smoking, make a plan to help you succeed.  Quit smoking right away, not slowly over a period of time.  When you start quitting, seek help from your doctor, family, or friends. This information is not intended to replace advice given to you by your health care provider. Make sure you discuss any questions you  have with your health care provider. Document Revised: 11/20/2018 Document Reviewed: 05/16/2018 Elsevier Patient Education  [address]2021 Elsevier Inc.      Signed, Jacolyn ReedyMichele Mahala Rommel, [place] JerseyPA-C  07/04/2020 8:45 AM    [facility] [address], [place], [place]  [address] Phone: [phone]; Fax: [phone]

## 2020-07-04 ENCOUNTER — Ambulatory Visit (INDEPENDENT_AMBULATORY_CARE_PROVIDER_SITE_OTHER): Payer: BC Managed Care – PPO | Admitting: Physician Assistant

## 2020-07-04 ENCOUNTER — Encounter: Payer: Self-pay | Admitting: Physician Assistant

## 2020-07-04 ENCOUNTER — Encounter (INDEPENDENT_AMBULATORY_CARE_PROVIDER_SITE_OTHER): Payer: Self-pay

## 2020-07-04 ENCOUNTER — Other Ambulatory Visit: Payer: Self-pay

## 2020-07-04 VITALS — BP 122/80 | HR 67 | Ht 72.0 in | Wt 218.8 lb

## 2020-07-04 DIAGNOSIS — I480 Paroxysmal atrial fibrillation: Secondary | ICD-10-CM | POA: Diagnosis not present

## 2020-07-04 DIAGNOSIS — Z720 Tobacco use: Secondary | ICD-10-CM | POA: Diagnosis not present

## 2020-07-04 LAB — COMPREHENSIVE METABOLIC PANEL
ALT: 11 IU/L (ref 0–44)
AST: 13 IU/L (ref 0–40)
Albumin/Globulin Ratio: 2.4 — ABNORMAL HIGH (ref 1.2–2.2)
Albumin: 4.4 g/dL (ref 3.8–4.9)
Alkaline Phosphatase: 55 IU/L (ref 44–121)
BUN/Creatinine Ratio: 17 (ref 9–20)
BUN: 13 mg/dL (ref 6–24)
Bilirubin Total: 0.6 mg/dL (ref 0.0–1.2)
CO2: 24 mmol/L (ref 20–29)
Calcium: 9.3 mg/dL (ref 8.7–10.2)
Chloride: 105 mmol/L (ref 96–106)
Creatinine, Ser: 0.78 mg/dL (ref 0.76–1.27)
Globulin, Total: 1.8 g/dL (ref 1.5–4.5)
Glucose: 92 mg/dL (ref 65–99)
Potassium: 4.2 mmol/L (ref 3.5–5.2)
Sodium: 141 mmol/L (ref 134–144)
Total Protein: 6.2 g/dL (ref 6.0–8.5)
eGFR: 103 mL/min/{1.73_m2} (ref >59)

## 2020-07-04 LAB — LIPID PANEL
Chol/HDL Ratio: 3.4 ratio (ref 0.0–5.0)
Cholesterol, Total: 151 mg/dL (ref 100–199)
HDL: 45 mg/dL (ref >39)
LDL Chol Calc (NIH): 91 mg/dL (ref 0–99)
Triglycerides: 75 mg/dL (ref 0–149)
VLDL Cholesterol Cal: 15 mg/dL (ref 5–40)

## 2020-07-04 LAB — CBC
Hematocrit: 41.3 % (ref 37.5–51.0)
Hemoglobin: 14 g/dL (ref 13.0–17.7)
MCH: 32 pg (ref 26.6–33.0)
MCHC: 33.9 g/dL (ref 31.5–35.7)
MCV: 95 fL (ref 79–97)
Platelets: 151 10*3/uL (ref 150–450)
RBC: 4.37 x10E6/uL (ref 4.14–5.80)
RDW: 12 % (ref 11.6–15.4)
WBC: 6.1 10*3/uL (ref 3.4–10.8)

## 2020-07-04 LAB — TSH: TSH: 1.36 u[IU]/mL (ref 0.450–4.500)

## 2020-07-04 MED ORDER — METOPROLOL SUCCINATE ER 50 MG PO TB24: 50.0000 mg | ORAL_TABLET | Freq: Every day | ORAL | 11 refills | Status: DC

## 2020-07-04 NOTE — Patient Instructions (Addendum)
Medication Instructions:  Your physician recommends that you continue on your current medications as directed. Please refer to the Current Medication list given to you today.  *If you need a refill on your cardiac medications before your next appointment, please call your pharmacy*   Lab Work: Lipids, Cbc, Cmp, Tsh- Today   If you have labs (blood work) drawn today and your tests are completely normal, you will receive your results only by: Marland Kitchen MyChart Message (if you have MyChart) OR . A paper copy in the mail If you have any lab test that is abnormal or we need to change your treatment, we will call you to review the results.   Testing/Procedures: None ordered    Follow-Up: At [facility], you and your health needs are our priority.  As part of our continuing mission to provide you with exceptional heart care, we have created designated Provider Care Teams.  These Care Teams include your primary Cardiologist (physician) and Advanced Practice Providers (APPs -  Physician Assistants and Nurse Practitioners) who all work together to provide you with the care you need, when you need it.  We recommend signing up for the patient portal called "MyChart".  Sign up information is provided on this After Visit Summary.  MyChart is used to connect with patients for Virtual Visits (Telemedicine).  Patients are able to view lab/test results, encounter notes, upcoming appointments, etc.  Non-urgent messages can be sent to your provider as well.   To learn more about what you can do with MyChart, go to [email or web address].    Your next appointment:   12 month(s)  The format for your next appointment:   In Person  Provider:   You may see Lesleigh Noe, MD or one of the following Advanced Practice Providers on your designated Care Team:    Georgie Chard, NP    Other Instructions  Steps to Quit Smoking Smoking tobacco is the leading cause of preventable death. It can affect almost  every organ in the body. Smoking puts you and people around you at risk for many serious, long-lasting (chronic) diseases. Quitting smoking can be hard, but it is one of the best things that you can do for your health. It is never too late to quit. How do I get ready to quit? When you decide to quit smoking, make a plan to help you succeed. Before you quit:  Pick a date to quit. Set a date within the next 2 weeks to give you time to prepare.  Write down the reasons why you are quitting. Keep this list in places where you will see it often.  Tell your family, friends, and co-workers that you are quitting. Their support is important.  Talk with your doctor about the choices that may help you quit.  Find out if your health insurance will pay for these treatments.  Know the people, places, things, and activities that make you want to smoke (triggers). Avoid them. What first steps can I take to quit smoking?  Throw away all cigarettes at home, at work, and in your car.  Throw away the things that you use when you smoke, such as ashtrays and lighters.  Clean your car. Make sure to empty the ashtray.  Clean your home, including curtains and carpets. What can I do to help me quit smoking? Talk with your doctor about taking medicines and seeing a counselor at the same time. You are more likely to succeed when you do both.  If you are pregnant or breastfeeding, talk with your doctor about counseling or other ways to quit smoking. Do not take medicine to help you quit smoking unless your doctor tells you to do so. To quit smoking: Quit right away  Quit smoking totally, instead of slowly cutting back on how much you smoke over a period of time.  Go to counseling. You are more likely to quit if you go to counseling sessions regularly. Take medicine You may take medicines to help you quit. Some medicines need a prescription, and some you can buy over-the-counter. Some medicines may contain a  drug called nicotine to replace the nicotine in cigarettes. Medicines may:  Help you to stop having the desire to smoke (cravings).  Help to stop the problems that come when you stop smoking (withdrawal symptoms). Your doctor may ask you to use:  Nicotine patches, gum, or lozenges.  Nicotine inhalers or sprays.  Non-nicotine medicine that is taken by mouth. Find resources Find resources and other ways to help you quit smoking and remain smoke-free after you quit. These resources are most helpful when you use them often. They include:  Online chats with a Veterinary surgeon.  Phone quitlines.  Printed Materials engineer.  Support groups or group counseling.  Text messaging programs.  Mobile phone apps. Use apps on your mobile phone or tablet that can help you stick to your quit plan. There are many free apps for mobile phones and tablets as well as websites. Examples include Quit Guide from the [employer] and smokefree.gov   What things can I do to make it easier to quit?  Talk to your family and friends. Ask them to support and encourage you.  Call a phone quitline (1-800-QUIT-NOW), reach out to support groups, or work with a Veterinary surgeon.  Ask people who smoke to not smoke around you.  Avoid places that make you want to smoke, such as: ? Bars. ? Parties. ? Smoke-break areas at work.  Spend time with people who do not smoke.  Lower the stress in your life. Stress can make you want to smoke. Try these things to help your stress: ? Getting regular exercise. ? Doing deep-breathing exercises. ? Doing yoga. ? Meditating. ? Doing a body scan. To do this, close your eyes, focus on one area of your body at a time from head to toe. Notice which parts of your body are tense. Try to relax the muscles in those areas.   How will I feel when I quit smoking? Day 1 to 3 weeks Within the first 24 hours, you may start to have some problems that come from quitting tobacco. These problems are very bad 2-3  days after you quit, but they do not often last for more than 2-3 weeks. You may get these symptoms:  Mood swings.  Feeling restless, nervous, angry, or annoyed.  Trouble concentrating.  Dizziness.  Strong desire for high-sugar foods and nicotine.  Weight gain.  Trouble pooping (constipation).  Feeling like you may vomit (nausea).  Coughing or a sore throat.  Changes in how the medicines that you take for other issues work in your body.  Depression.  Trouble sleeping (insomnia). Week 3 and afterward After the first 2-3 weeks of quitting, you may start to notice more positive results, such as:  Better sense of smell and taste.  Less coughing and sore throat.  Slower heart rate.  Lower blood pressure.  Clearer skin.  Better breathing.  Fewer sick days. Quitting smoking can be  hard. Do not give up if you fail the first time. Some people need to try a few times before they succeed. Do your best to stick to your quit plan, and talk with your doctor if you have any questions or concerns. Summary  Smoking tobacco is the leading cause of preventable death. Quitting smoking can be hard, but it is one of the best things that you can do for your health.  When you decide to quit smoking, make a plan to help you succeed.  Quit smoking right away, not slowly over a period of time.  When you start quitting, seek help from your doctor, family, or friends. This information is not intended to replace advice given to you by your health care provider. Make sure you discuss any questions you have with your health care provider. Document Revised: 11/20/2018 Document Reviewed: 05/16/2018 Elsevier Patient Education  2021 [employer].

## 2021-07-02 NOTE — Progress Notes (Signed)
? ?Cardiology Office Note   ? ?Date:  07/10/2021  ? ?ID:  Dale Dalton, DOB 02/23/1961, MRN [id number] ? ? ?PCP:  Patient, No Pcp Per (Inactive) ?  ?[facility]  ?Cardiologist:  Lesleigh NoeHenry W Smith III, MD   ?Advanced Practice Provider:  No care team member to display ?Electrophysiologist:  None  ? ?[id number]}  ? ?Chief Complaint  ?Patient presents with  ? Follow-up  ? ? ?History of Present Illness:  ?Dale Dalton is a 61 y.o. male with history of 1 episode of atrial fibrillation with RVR 2016 CHA2DS2-VASc equals 0 and no recurrence, tobacco abuse. ?  ?Patient last saw Dr. Katrinka BlazingSmith 07/08/2019 at which time he had occasional palpitations but no prolonged episodes. ?  ?I saw patient 07/04/20 and he thought he had one episode of Afib when he had covid19 lasting less than 5 min. ? ?Patient comes in for yearly f/u. Has had stomach issues after eating out last week and nausea and cramping. Got a little dizzy but no Afib. He works at [employer] and does a lot of walking, lifting, climbing ladders without difficulty. Walks an hour on weekends. Denies chest pain, dyspnea, palpitations, edema. Still smoking 7-8 cigarettes daily.  ? ? ?Past Medical History:  ?Diagnosis Date  ? Allergic rhinitis   ? 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 History of echocardiogram   ? Echo 10/16:EF 55-60%, normal wall motion, mild MR, mild RAE  ? PAF (paroxysmal atrial fibrillation) (HCC)   ? CHADS2-VASc=0  ? ? ?No past surgical history on file. ? ?Current Medications: ?No outpatient medications have been marked as taking for the 07/10/21 encounter (Office Visit) with Dyann KiefLenze, Amey Hossain M, PA-C.  ?  ? ?Allergies:   Novocain [procaine] and Peanut-containing drug products  ? ?Social History  ? ?Socioeconomic History  ? Marital status: Single  ?  Spouse name: Not on file  ? Number of children: Not on file  ? Years of education: Not on file  ? Highest education level: Not on file  ?Occupational History  ? Not on file  ?Tobacco Use  ? Smoking  status: Light Smoker  ?  Packs/day: 0.01  ?  Years: 35.00  ?  Pack years: 0.35  ?  Types: Cigarettes  ? Smokeless tobacco: Never  ?Vaping Use  ? Vaping Use: Never used  ?Substance and Sexual Activity  ? Alcohol use: Yes  ?  Alcohol/week: 4.0 standard drinks  ?  Types: 4 Cans of beer per week  ? Drug use: No  ? Sexual activity: Not on file  ?Other Topics Concern  ? Not on file  ?Social History Narrative  ? Not on file  ? ?Social Determinants of Health  ? ?Financial Resource Strain: Not on file  ?Food Insecurity: Not on file  ?Transportation Needs: Not on file  ?Physical Activity: Not on file  ?Stress: Not on file  ?Social Connections: Not on file  ?  ? ?Family History:  The patient's  family history includes Cancer in his father and mother; Heart Problems in his brother.  ? ?ROS:   ?Please see the history of present illness.    ?ROS All other systems reviewed and are negative. ? ? ?PHYSICAL EXAM:   ?VS:  BP 124/72   Pulse 66   Ht 6\' 1"  (1.854 m)   Wt 220 lb 9.6 oz (100.1 kg)   SpO2 98%   BMI 29.10 kg/m?   ?Physical Exam  ?GEN: Well nourished, well developed, in  no acute distress  ?Neck: no JVD, carotid bruits, or masses ?Cardiac:RRR; no murmurs, rubs, or gallops  ?Respiratory:  clear to auscultation bilaterally, normal work of breathing ?GI: soft, nontender, nondistended, + BS ?Ext: without cyanosis, clubbing, or edema, Good distal pulses bilaterally ?Neuro:  Alert and Oriented x 3, ?Psych: euthymic mood, full affect ? ?Wt Readings from Last 3 Encounters:  ?07/10/21 220 lb 9.6 oz (100.1 kg)  ?07/04/20 218 lb 12.8 oz (99.2 kg)  ?07/08/19 219 lb 12.8 oz (99.7 kg)  ?  ? ? ?Studies/Labs Reviewed:  ? ?EKG:  EKG is  ordered today.  The ekg ordered today demonstrates NSR, normal EKG ? ?Recent Labs: ?No results found for requested labs within last 8760 hours.  ? ?Lipid Panel ?   ?Component Value Date/Time  ? CHOL 151 07/04/2020 0847  ? TRIG 75 07/04/2020 0847  ? HDL 45 07/04/2020 0847  ? CHOLHDL 3.4 07/04/2020 0847  ?  LDLCALC 91 07/04/2020 0847  ? ? ?Additional studies/ records that were reviewed today include:  ?2D echo 12/19/2014 ?Study Conclusions  ? ?- Left ventricle: The cavity size was normal. Wall thickness was  ?  normal. Systolic function was normal. The estimated ejection  ?  fraction was in the range of 55% to 60%. Wall motion was normal;  ?  there were no regional wall motion abnormalities. Left  ?  ventricular diastolic function parameters were normal.  ?- Mitral valve: There was mild regurgitation.  ?- Right atrium: The atrium was mildly dilated.  ?  ? ? ?Risk Assessment/Calculations:   ? ?CHA2DS2-VASc Score = 0  ? This indicates a 0.2% annual risk of stroke. ?The patient's score is based upon: ?CHF History: 0 ?HTN History: 0 ?Diabetes History: 0 ?Stroke History: 0 ?Vascular Disease History: 0 ?Age Score: 0 ?Gender Score: 0 ?  ?  ? ? ? ? ?ASSESSMENT:   ? ?1. Paroxysmal atrial fibrillation (HCC)   ?2. Tobacco abuse   ?3. Constipation, unspecified constipation type   ? ? ? ?PLAN:  ?In order of problems listed above: ? ?PAF with RVR 1 episode in 2016 CHA2DS2-VASc score 0, may have had recurrence in Jan when he had covid19 and had a lot of congestion. Lasted less than 5 min. No Afib since then. He says Toprol is working well but makes him tired. Will check labs today. He did have some apple juice and donuts so will have to keep that in mind. ?  ?Tobacco abuse-smoking cessation discussed-declined nicotine patches. ? ?Constipation-has never had a colonoscopy and eating high sugar diet. Offered referral to GI for colonoscopy but he declined. ? ?Shared Decision Making/Informed Consent   ?   ? ? ?Medication Adjustments/Labs and Tests Ordered: ?Current medicines are reviewed at length with the patient today.  Concerns regarding medicines are outlined above.  Medication changes, Labs and Tests ordered today are listed in the Patient Instructions below. ?Patient Instructions  ?Medication Instructions:  ?Your physician  recommends that you continue on your current medications as directed. Please refer to the Current Medication list given to you today. ? ?*If you need a refill on your cardiac medications before your next appointment, please call your pharmacy* ? ? ?Lab Work: ?TODAY: CMET, CBC, FLP, TSH ? ?If you have labs (blood work) drawn today and your tests are completely normal, you will receive your results only by: ?MyChart Message (if you have MyChart) OR ?A paper copy in the mail ?If you have any lab test that is abnormal or we  need to change your treatment, we will call you to review the results. ? ? ?Follow-Up: ?At [facility], you and your health needs are our priority.  As part of our continuing mission to provide you with exceptional heart care, we have created designated Provider Care Teams.  These Care Teams include your primary Cardiologist (physician) and Advanced Practice Providers (APPs -  Physician Assistants and Nurse Practitioners) who all work together to provide you with the care you need, when you need it. ? ?We recommend signing up for the patient portal called "MyChart".  Sign up information is provided on this After Visit Summary.  MyChart is used to connect with patients for Virtual Visits (Telemedicine).  Patients are able to view lab/test results, encounter notes, upcoming appointments, etc.  Non-urgent messages can be sent to your provider as well.   ?To learn more about what you can do with MyChart, go to [email or web address].   ? ?Your next appointment:   ?1 year(s) ? ?The format for your next appointment:   ?In Person ? ?Provider:   ?Lesleigh Noe, MD   ? ? ?Other Instructions ?For a Primary Care Physician call - [facility] #[phone] ?  ?Heart-Healthy Eating Plan ?Heart-healthy meal planning includes: ?Eating less unhealthy fats. ?Eating more healthy fats. ?Making other changes in your diet. ?Talk with your doctor or a diet specialist (dietitian) to create an eating  plan that is right for you. ? ?What are tips for following this plan? ?Cooking ?Avoid frying your food. Try to bake, boil, grill, or broil it instead. You can also reduce fat by: ?Removing the skin from poultry. ?Removin

## 2021-07-10 ENCOUNTER — Encounter: Payer: Self-pay | Admitting: Physician Assistant

## 2021-07-10 ENCOUNTER — Ambulatory Visit (INDEPENDENT_AMBULATORY_CARE_PROVIDER_SITE_OTHER): Payer: No Typology Code available for payment source | Admitting: Physician Assistant

## 2021-07-10 VITALS — BP 124/72 | HR 66 | Ht 73.0 in | Wt 220.6 lb

## 2021-07-10 DIAGNOSIS — Z720 Tobacco use: Secondary | ICD-10-CM | POA: Diagnosis not present

## 2021-07-10 DIAGNOSIS — I480 Paroxysmal atrial fibrillation: Secondary | ICD-10-CM

## 2021-07-10 DIAGNOSIS — K5900 Constipation, unspecified: Secondary | ICD-10-CM | POA: Diagnosis not present

## 2021-07-10 LAB — CBC
Hematocrit: 43.4 % (ref 37.5–51.0)
Hemoglobin: 15 g/dL (ref 13.0–17.7)
MCH: 33.2 pg — ABNORMAL HIGH (ref 26.6–33.0)
MCHC: 34.6 g/dL (ref 31.5–35.7)
MCV: 96 fL (ref 79–97)
Platelets: 147 10*3/uL — ABNORMAL LOW (ref 150–450)
RBC: 4.52 x10E6/uL (ref 4.14–5.80)
RDW: 12.2 % (ref 11.6–15.4)
WBC: 6.3 10*3/uL (ref 3.4–10.8)

## 2021-07-10 LAB — COMPREHENSIVE METABOLIC PANEL
ALT: 12 IU/L (ref 0–44)
AST: 12 IU/L (ref 0–40)
Albumin/Globulin Ratio: 2.4 — ABNORMAL HIGH (ref 1.2–2.2)
Albumin: 4.6 g/dL (ref 3.8–4.9)
Alkaline Phosphatase: 54 IU/L (ref 44–121)
BUN/Creatinine Ratio: 11 (ref 10–24)
BUN: 9 mg/dL (ref 8–27)
Bilirubin Total: 0.6 mg/dL (ref 0.0–1.2)
CO2: 24 mmol/L (ref 20–29)
Calcium: 9.3 mg/dL (ref 8.6–10.2)
Chloride: 104 mmol/L (ref 96–106)
Creatinine, Ser: 0.81 mg/dL (ref 0.76–1.27)
Globulin, Total: 1.9 g/dL (ref 1.5–4.5)
Glucose: 99 mg/dL (ref 70–99)
Potassium: 4.1 mmol/L (ref 3.5–5.2)
Sodium: 140 mmol/L (ref 134–144)
Total Protein: 6.5 g/dL (ref 6.0–8.5)
eGFR: 101 mL/min/{1.73_m2} (ref >59)

## 2021-07-10 LAB — TSH: TSH: 1.29 u[IU]/mL (ref 0.450–4.500)

## 2021-07-10 LAB — LIPID PANEL
Chol/HDL Ratio: 3.2 ratio (ref 0.0–5.0)
Cholesterol, Total: 144 mg/dL (ref 100–199)
HDL: 45 mg/dL (ref >39)
LDL Chol Calc (NIH): 82 mg/dL (ref 0–99)
Triglycerides: 87 mg/dL (ref 0–149)
VLDL Cholesterol Cal: 17 mg/dL (ref 5–40)

## 2021-07-10 MED ORDER — METOPROLOL SUCCINATE ER 50 MG PO TB24: 50.0000 mg | ORAL_TABLET | Freq: Every day | ORAL | 3 refills | Status: DC

## 2021-07-10 NOTE — Patient Instructions (Signed)
Medication Instructions:  ?Your physician recommends that you continue on your current medications as directed. Please refer to the Current Medication list given to you today. ? ?*If you need a refill on your cardiac medications before your next appointment, please call your pharmacy* ? ? ?Lab Work: ?TODAY: CMET, CBC, FLP, TSH ? ?If you have labs (blood work) drawn today and your tests are completely normal, you will receive your results only by: ?MyChart Message (if you have MyChart) OR ?A paper copy in the mail ?If you have any lab test that is abnormal or we need to change your treatment, we will call you to review the results. ? ? ?Follow-Up: ?At [facility], you and your health needs are our priority.  As part of our continuing mission to provide you with exceptional heart care, we have created designated Provider Care Teams.  These Care Teams include your primary Cardiologist (physician) and Advanced Practice Providers (APPs -  Physician Assistants and Nurse Practitioners) who all work together to provide you with the care you need, when you need it. ? ?We recommend signing up for the patient portal called "MyChart".  Sign up information is provided on this After Visit Summary.  MyChart is used to connect with patients for Virtual Visits (Telemedicine).  Patients are able to view lab/test results, encounter notes, upcoming appointments, etc.  Non-urgent messages can be sent to your provider as well.   ?To learn more about what you can do with MyChart, go to [email or web address].   ? ?Your next appointment:   ?1 year(s) ? ?The format for your next appointment:   ?In Person ? ?Provider:   ?Lesleigh NoeHenry W Smith III, MD   ? ? ?Other Instructions ?For a Primary Care Physician call - [facility] #[phone] ?  ?Heart-Healthy Eating Plan ?Heart-healthy meal planning includes: ?Eating less unhealthy fats. ?Eating more healthy fats. ?Making other changes in your diet. ?Talk with your doctor or a diet  specialist (dietitian) to create an eating plan that is right for you. ? ?What are tips for following this plan? ?Cooking ?Avoid frying your food. Try to bake, boil, grill, or broil it instead. You can also reduce fat by: ?Removing the skin from poultry. ?Removing all visible fats from meats. ?Steaming vegetables in water or broth. ?Meal planning ? ?At meals, divide your plate into four equal parts: ?Fill one-half of your plate with vegetables and green salads. ?Fill one-fourth of your plate with whole grains. ?Fill one-fourth of your plate with lean protein foods. ?Eat 4-5 servings of vegetables per day. A serving of vegetables is: ?1 cup of raw or cooked vegetables. ?2 cups of raw leafy greens. ?Eat 4-5 servings of fruit per day. A serving of fruit is: ?1 medium whole fruit. ?? cup of dried fruit. ?? cup of fresh, frozen, or canned fruit. ?? cup of 100% fruit juice. ?Eat more foods that have soluble fiber. These are apples, broccoli, carrots, beans, peas, and barley. Try to get 20-30 g of fiber per day. ?Eat 4-5 servings of nuts, legumes, and seeds per week: ?1 serving of dried beans or legumes equals ? cup after being cooked. ?1 serving of nuts is ? cup. ?1 serving of seeds equals 1 tablespoon. ?General information ?Eat more home-cooked food. Eat less restaurant, buffet, and fast food. ?Limit or avoid alcohol. ?Limit foods that are high in starch and sugar. ?Avoid fried foods. ?Lose weight if you are overweight. ?Keep track of how much salt (sodium) you eat. This is important  if you have high blood pressure. Ask your doctor to tell you more about this. ?Try to add vegetarian meals each week. ?Fats ?Choose healthy fats. These include olive oil and canola oil, flaxseeds, walnuts, almonds, and seeds. ?Eat more omega-3 fats. These include salmon, mackerel, sardines, tuna, flaxseed oil, and ground flaxseeds. Try to eat fish at least 2 times each week. ?Check food labels. Avoid foods with trans fats or high amounts of  saturated fat. ?Limit saturated fats. ?These are often found in animal products, such as meats, butter, and cream. ?These are also found in plant foods, such as palm oil, palm kernel oil, and coconut oil. ?Avoid foods with partially hydrogenated oils in them. These have trans fats. Examples are stick margarine, some tub margarines, cookies, crackers, and other baked goods. ?What foods can I eat? ?Fruits ?All fresh, canned (in natural juice), or frozen fruits. ?Vegetables ?Fresh or frozen vegetables (raw, steamed, roasted, or grilled). Green salads. ?Grains ?Most grains. Choose whole wheat and whole grains most of the time. Rice and pasta, including brown rice and pastas made with whole wheat. ?Meats and other proteins ?Lean, well-trimmed beef, veal, pork, and lamb. Chicken and [place] without skin. All fish and shellfish. Wild duck, rabbit, pheasant, and venison. Egg whites or low-cholesterol egg substitutes. Dried beans, peas, lentils, and tofu. Seeds and most nuts. ?Dairy ?Low-fat or nonfat cheeses, including ricotta and mozzarella. Skim or 1% milk that is liquid, powdered, or evaporated. Buttermilk that is made with low-fat milk. Nonfat or low-fat yogurt. ?Fats and oils ?Non-hydrogenated (trans-free) margarines. Vegetable oils, including soybean, sesame, sunflower, olive, peanut, safflower, corn, canola, and cottonseed. Salad dressings or mayonnaise made with a vegetable oil. ?Beverages ?Mineral water. Coffee and tea. Diet carbonated beverages. ?Sweets and desserts ?Sherbet, gelatin, and fruit ice. Small amounts of dark chocolate. ?Limit all sweets and desserts. ?Seasonings and condiments ?All seasonings and condiments. ?The items listed above may not be a complete list of foods and drinks you can eat. Contact a dietitian for more options. ?What foods should I avoid? ?Fruits ?Canned fruit in heavy syrup. Fruit in cream or butter sauce. Fried fruit. Limit coconut. ?Vegetables ?Vegetables cooked in cheese, cream, or  butter sauce. Fried vegetables. ?Grains ?Breads that are made with saturated or trans fats, oils, or whole milk. Croissants. Sweet rolls. Donuts. High-fat crackers, such as cheese crackers. ?Meats and other proteins ?Fatty meats, such as hot dogs, ribs, sausage, bacon, rib-eye roast or steak. High-fat deli meats, such as salami and bologna. Caviar. Domestic duck and goose. Organ meats, such as liver. ?Dairy ?Cream, sour cream, cream cheese, and creamed cottage cheese. Whole-milk cheeses. Whole or 2% milk that is liquid, evaporated, or condensed. Whole buttermilk. Cream sauce or high-fat cheese sauce. Yogurt that is made from whole milk. ?Fats and oils ?Meat fat, or shortening. Cocoa butter, hydrogenated oils, palm oil, coconut oil, palm kernel oil. Solid fats and shortenings, including bacon fat, salt pork, lard, and butter. Nondairy cream substitutes. Salad dressings with cheese or sour cream. ?Beverages ?Regular sodas and juice drinks with added sugar. ?Sweets and desserts ?Frosting. Pudding. Cookies. Cakes. Pies. Milk chocolate or white chocolate. Buttered syrups. Full-fat ice cream or ice cream drinks. ?The items listed above may not be a complete list of foods and drinks to avoid. Contact a dietitian for more information. ?Summary ?Heart-healthy meal planning includes eating less unhealthy fats, eating more healthy fats, and making other changes in your diet. ?Eat a balanced diet. This includes fruits and vegetables, low-fat or nonfat dairy,  lean protein, nuts and legumes, whole grains, and heart-healthy oils and fats. ?This information is not intended to replace advice given to you by your health care provider. Make sure you discuss any questions you have with your health care provider. ?Document Revised: 07/06/2020 Document Reviewed: 07/06/2020 ?Elsevier Patient Education ? 2022 Elsevier Inc. ? ?Managing the Challenge of Quitting Smoking ?Quitting smoking is a physical and mental challenge. You may have  cravings, withdrawal symptoms, and temptation to smoke. Before quitting, work with your health care provider to make a plan that can help you manage quitting. Making a plan before you quit may keep you from s

## 2021-09-21 ENCOUNTER — Telehealth: Payer: Self-pay | Admitting: *Deleted

## 2021-09-21 NOTE — Telephone Encounter (Signed)
   Pre-operative Risk Assessment    Patient Name: Dale Dalton  DOB: 04/15/1960 MRN: [id number]      Request for Surgical Clearance    Procedure:   HERNIA REPAIR  Date of Surgery:  Clearance TBD                                 Surgeon:  Ivar Drape, MD Surgeon's Group or Practice Name:  [facility] Phone number:  [phone] Fax number:  [phone]  ATTN:  JOCELYN   Type of Clearance Requested:   - Medical  - Pharmacy:  Hold Aspirin NOT INDICATED   Type of Anesthesia:  General    Additional requests/questions:    Dale Dalton   09/21/2021, 4:21 PM

## 2021-09-25 NOTE — Telephone Encounter (Signed)
   Name: Dale Dalton  DOB: February 20, 1961  MRN: [id number]  Primary Cardiologist: Lesleigh Noe, MD   Preoperative team, please contact this patient and set up a phone call appointment for further preoperative risk assessment. Please obtain consent and complete medication review. Thank you for your help.  I confirm that guidance regarding antiplatelet and oral anticoagulation therapy has been completed and, if necessary, noted below.    [place], [place] 09/25/2021, 11:24 AM [facility] [address] [place], [place] [address]

## 2021-09-26 NOTE — Telephone Encounter (Signed)
Left message for the pt to call back for a  tele visit for pre op clearance.

## 2021-09-27 NOTE — Telephone Encounter (Signed)
Patient returned your call.

## 2021-09-28 ENCOUNTER — Telehealth: Payer: Self-pay | Admitting: *Deleted

## 2021-09-28 NOTE — Telephone Encounter (Signed)
Pt called back and he has been scheduled a tele visit 10/03/21 2:20. Med rec and consent are done.     Patient Consent for Virtual Visit        Dale Dalton has provided verbal consent on 09/28/2021 for a virtual visit (video or telephone).   CONSENT FOR VIRTUAL VISIT FOR:  Dale Dalton  By participating in this virtual visit I agree to the following:  I hereby voluntarily request, consent and authorize [facility] and its employed or contracted physicians, physician assistants, nurse practitioners or other licensed health care professionals (the Practitioner), to provide me with telemedicine health care services (the "Services") as deemed necessary by the treating Practitioner. I acknowledge and consent to receive the Services by the Practitioner via telemedicine. I understand that the telemedicine visit will involve communicating with the Practitioner through live audiovisual communication technology and the disclosure of certain medical information by electronic transmission. I acknowledge that I have been given the opportunity to request an in-person assessment or other available alternative prior to the telemedicine visit and am voluntarily participating in the telemedicine visit.  I understand that I have the right to withhold or withdraw my consent to the use of telemedicine in the course of my care at any time, without affecting my right to future care or treatment, and that the Practitioner or I may terminate the telemedicine visit at any time. I understand that I have the right to inspect all information obtained and/or recorded in the course of the telemedicine visit and may receive copies of available information for a reasonable fee.  I understand that some of the potential risks of receiving the Services via telemedicine include:  Delay or interruption in medical evaluation due to technological equipment failure or disruption; Information transmitted may not be sufficient (e.g.  poor resolution of images) to allow for appropriate medical decision making by the Practitioner; and/or  In rare instances, security protocols could fail, causing a breach of personal health information.  Furthermore, I acknowledge that it is my responsibility to provide information about my medical history, conditions and care that is complete and accurate to the best of my ability. I acknowledge that Practitioner's advice, recommendations, and/or decision may be based on factors not within their control, such as incomplete or inaccurate data provided by me or distortions of diagnostic images or specimens that may result from electronic transmissions. I understand that the practice of medicine is not an exact science and that Practitioner makes no warranties or guarantees regarding treatment outcomes. I acknowledge that a copy of this consent can be made available to me via my patient portal [facility] MyChart), or I can request a printed copy by calling the office of [facility].    I understand that my insurance will be billed for this visit.   I have read or had this consent read to me. I understand the contents of this consent, which adequately explains the benefits and risks of the Services being provided via telemedicine.  I have been provided ample opportunity to ask questions regarding this consent and the Services and have had my questions answered to my satisfaction. I give my informed consent for the services to be provided through the use of telemedicine in my medical care

## 2021-09-28 NOTE — Telephone Encounter (Signed)
Pt called back and he has been scheduled a tele visit 10/03/21 2:20. Med rec and consent are done.

## 2021-09-28 NOTE — Telephone Encounter (Signed)
I just tried to call the pt back but he did not answer and no vm came on.

## 2021-09-28 NOTE — Telephone Encounter (Signed)
Patient returned RN's call.  Will

## 2021-10-03 ENCOUNTER — Telehealth: Payer: BC Managed Care – PPO

## 2021-10-03 NOTE — Telephone Encounter (Signed)
Per the pre op provider today Dale Dalton, [facility] to reschedule tele appt to next week. I tried to reach the pt but he did not answer nor vm came on.   Per pre op provider please cancel his visit today, no chart, he is still waiting on Worker's comp, we eventually agreed to push the virtual appt back by 1 week while he make sure everything is good to go from [employer] and he is definitely having the surgery in the near future   I will update the requesting office at this time to see the notes above.

## 2021-10-04 NOTE — Telephone Encounter (Signed)
Left message for the pt to call back and reschedule his tele pre op appt ; see notes

## 2021-10-05 NOTE — Telephone Encounter (Signed)
Pt is returning call and requesting call back. Call back on mobile phone.

## 2021-10-05 NOTE — Telephone Encounter (Signed)
  Pt has been rescheduled for tele visit 10/12/21.  Please call [phone] for appointment.

## 2021-10-12 ENCOUNTER — Ambulatory Visit (INDEPENDENT_AMBULATORY_CARE_PROVIDER_SITE_OTHER): Payer: BC Managed Care – PPO | Admitting: Physician Assistant

## 2021-10-12 DIAGNOSIS — Z01810 Encounter for preprocedural cardiovascular examination: Secondary | ICD-10-CM | POA: Diagnosis not present

## 2021-10-12 NOTE — Progress Notes (Signed)
Virtual Visit via Telephone Note   Because of Dale Dalton's co-morbid illnesses, he is at least at moderate risk for complications without adequate follow up.  This format is felt to be most appropriate for this patient at this time.  The patient did not have access to video technology/had technical difficulties with video requiring transitioning to audio format only (telephone).  All issues noted in this document were discussed and addressed.  No physical exam could be performed with this format.  Please refer to the patient's chart for his consent to telehealth for [facility].  Evaluation Performed:  Preoperative cardiovascular risk assessment _____________   Date:  10/12/2021   Patient ID:  Dale Dalton, DOB May 05, 1960, MRN [id number] Patient Location:  Home Provider location:   Office  Primary Care Provider:  Eliezer Lofts, MD Primary Cardiologist:  Dale Noe, MD  Chief Complaint / Patient Profile   61 y.o. y/o male with a h/o 1 episode of atrial fibrillation with RVR 2016 CHA2DS2-VASc is 0 with no recurrence, and tobacco abuse who is pending hernia repair and presents today for telephonic preoperative cardiovascular risk assessment.  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  Allergies  Allergies  Allergen Reactions   Novocain [Procaine] Other (See Comments)    Light-headed-"Almost passed out"   Peanut-Containing Drug Products Other (See Comments)    Itchy throat    History of Present Illness    Dale Dalton is a 61 y.o. male who presents via audio/video conferencing for a telehealth visit today.  Pt was last seen in cardiology clinic on 07/10/2021 by Dale Carson, PA.  At that time  Dale Dalton was doing well .  The patient is now pending procedure as outlined above. Since his last visit, he has been doing pretty well.  He does not have any chest pain, shortness of breath, or palpitations.  The only thing that he has issues with his sleepiness towards the end of the day.  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  The fatigue is nothing new.  His hernia does slow him down and he is looking forward to getting it fixed.  He states he does have some arthritis in his lower back which limits his activity.  He noticed that he was getting more myalgias and his statin was discontinued by his PCP.  He notes that this has been better since discontinuation.  He does occasionally have fluttering in his chest after a lot of caffeine with sodas and tea.  He has no issues with walking or going up and down stairs.  He can do all of his own inside housework.  Because of this he has scored a 5.62 METS on the DASI.  This exceeds the minimum requirement of 4 METS.  Per our office antiplatelet protocol, patient may hold ASA x7 days prior to procedure.  Please restart when medically safe to do so.  Reports no shortness of breath nor dyspnea on exertion. Reports no chest pain, pressure, or tightness. No edema, orthopnea, PND. Reports no palpitations.    Home Medications  Prior to Admission medications   Medication Sig Start Date End Date Taking? Authorizing Provider  aspirin EC 81 MG tablet Take 1 tablet (81 mg total) by mouth daily. 12/19/14   Dale Course, PA  metoprolol succinate (TOPROL-XL) 50 MG 24 hr tablet Take 1 tablet (50 mg total) by mouth daily. Take with or immediately following a meal. 07/10/21   Dale Kief, PA-C  pravastatin (PRAVACHOL) 10 MG tablet Take 10 mg by mouth daily.    [provider]  sodium chloride (OCEAN) 0.65 % SOLN nasal spray Place 2 sprays into both nostrils as needed (seasonal  allergies).    [provider]    Physical Exam    Vital Signs:  Dale Dalton does not have vital signs available for review today.  Given telephonic nature of communication, physical exam is limited. AAOx3. NAD. Normal affect.  Speech and respirations are unlabored.  Accessory Clinical Findings    None  Assessment & Plan    1.  Preoperative Cardiovascular Risk Assessment:  Click Here to Calculate RCRI      :[id number]}   Dale Dalton perioperative risk of a major cardiac event is 0.9% according to the Revised Cardiac Risk Index (RCRI).  Therefore, he is at low risk for perioperative complications.   His functional capacity is good at 5.62 METs according to the Duke Activity Status Index (DASI). Recommendations: According to ACC/AHA guidelines, no further cardiovascular testing needed.  The patient may proceed to surgery at acceptable risk.   Antiplatelet and/or Anticoagulation Recommendations: Aspirin can be held for 7 days prior to his surgery.  Please resume Aspirin post operatively when it is felt to be safe from a bleeding standpoint.   A copy of this note will be routed to requesting surgeon.  Time:   Today, I have spent 15 minutes with the patient with telehealth technology discussing medical history, symptoms, and management plan.     Dale Dory, PA-C  10/12/2021, 10:23 AM

## 2021-10-26 ENCOUNTER — Telehealth: Payer: Self-pay | Admitting: Interventional Cardiology

## 2021-10-26 NOTE — Telephone Encounter (Signed)
Returned call to patient informing him he may take Tylenol for pain relief as needed, to not exceed 3,000mg  in a 24 hour period.  Patient verbalized understanding and expressed appreciation for call.

## 2021-10-26 NOTE — Telephone Encounter (Signed)
Pt c/o medication issue:  1. Name of Medication: tylenol  2. How are you currently taking this medication (dosage and times per day)? Has not taken  3. Are you having a reaction (difficulty breathing--STAT)? no  4. What is your medication issue? Patient calling to see if it is okay for him to take tylenol for any pain, while he is off the aspirin for his upcoming hernia surgery.

## 2022-03-22 ENCOUNTER — Telehealth: Payer: Self-pay | Admitting: Physician Assistant

## 2022-03-22 ENCOUNTER — Encounter: Payer: Self-pay | Admitting: Interventional Cardiology

## 2022-03-22 MED ORDER — METOPROLOL SUCCINATE ER 50 MG PO TB24: 50.0000 mg | ORAL_TABLET | Freq: Every day | ORAL | 2 refills | Status: DC

## 2022-03-22 NOTE — Telephone Encounter (Signed)
*  STAT* If patient is at the pharmacy, call can be transferred to refill team.   1. Which medications need to be refilled? (please list name of each medication and dose if known) metoprolol succinate (TOPROL-XL) 50 MG 24 hr tablet   2. Which pharmacy/location (including street and city if local pharmacy) is medication to be sent to? [id number] - [place] [place] - [place]. AT [address]   3. Do they need a 30 day or 90 day supply? 30 day  Patient states the pharmacy won't refill the medication without a provider signing off on it. Patient is not sure if it is because Dr. Tamala Julian has retired. He says he only has 2 tablets left and won't have enough to finish the weekend.

## 2022-03-22 NOTE — Telephone Encounter (Signed)
Pt's medication was sent to pt's pharmacy as requested. Confirmation received.

## 2022-03-22 NOTE — Telephone Encounter (Signed)
error 

## 2022-03-25 NOTE — Telephone Encounter (Signed)
Medication was filled under Nicholes Rough, NP who saw the patient for a pre-op visit on 10/12/21.

## 2022-03-25 NOTE — Telephone Encounter (Signed)
Called pharmacy notified that Dale Dalton is still actively working.  NPI number [id number] verified.

## 2022-03-25 NOTE — Telephone Encounter (Signed)
Pt c/o medication issue:  1. Name of Medication:   2. How are you currently taking this medication (dosage and times per day)? metoprolol succinate (TOPROL-XL) 50 MG 24 hr tablet    3. Are you having a reaction (difficulty breathing--STAT)?   4. What is your medication issue?  [facility] requesting a different provider call this in due to current prescriber license showing as inactive with patients insurance.

## 2022-07-17 NOTE — Progress Notes (Signed)
Cardiology Office Note:    Date:  07/23/2022   ID:  Dale Dalton, DOB May 27, 1960, MRN [id number]  PCP:  Eliezer Lofts, MD  [facility] Providers Cardiologist:  Lesleigh Noe, MD (Inactive)     Referring MD: Eliezer Lofts, MD   Chief Complaint:  Follow-up     History of Present Illness:   Dale Dalton is a 62 y.o. male with  history of 1 episode of atrial fibrillation with RVR 2016 CHA2DS2-VASc equals 0 and no recurrence, tobacco abuse.   Patient last saw Dr. Katrinka Blazing 07/08/2019 at which time he had occasional palpitations but no prolonged episodes.   I saw patient 07/2021 and he thought he had one episode of Afib when he had covid19 lasting less than 5 min.      Patient comes in for f/u. Occasionally he feels a flutter when he drinks too much caffeine or doesn't stay hydrated. Doesn't last long. He drinks 2-3 12 ounce Pepsi's daily.  Smokes 7-10 cigarettes daily.  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 Walks on his days off. Denies chest pain, dyspnea, edema. He was put on pravastatin for elevated cholesterol and developed severe leg cramps.      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   metoprolol succinate (TOPROL-XL) 50 MG 24 hr tablet Take 1 tablet (50 mg total) by mouth daily. Take with or immediately following a meal.   sodium chloride (OCEAN) 0.65 % SOLN nasal spray Place 2 sprays into both nostrils as needed (seasonal allergies).    Allergies:   Novocain [procaine] and Peanut-containing drug products   Social History   Tobacco Use   Smoking status: Light Smoker    Packs/day: 0.01    Years: 35.00     Additional pack years: 0.00    Total pack years: 0.35    Types: Cigarettes   Smokeless tobacco: Never  Vaping Use   Vaping Use: Never used  Substance Use Topics   Alcohol use: Yes    Alcohol/week: 4.0 standard drinks of alcohol    Types: 4 Cans of beer per week   Drug use: No    Family Hx: The patient's family history includes Cancer in his father and mother; Heart Problems in his brother.  ROS     Physical Exam:    VS:  BP 128/84   Pulse (!) 57   Ht 6\' 1"  (1.854 m)   Wt 222 lb 9.6 oz (101 kg)   SpO2 99%   BMI 29.37 kg/m     Wt Readings from Last 3 Encounters:  07/23/22 222 lb 9.6 oz (101 kg)  07/10/21 220 lb 9.6 oz (100.1 kg)  07/04/20 218 lb 12.8 oz (99.2 kg)    Physical Exam  GEN: Well nourished, well developed, in no acute distress  Neck: no JVD, carotid bruits, or masses Cardiac:RRR; no murmurs, rubs, or gallops  Respiratory:  clear to auscultation bilaterally, normal work of breathing GI: soft, nontender, nondistended, + BS Ext: without cyanosis, clubbing, or edema, Good distal pulses bilaterally Neuro:  Alert and Oriented x 3,  Psych: euthymic mood, full affect        EKGs/Labs/Other Test Reviewed:    EKG:  EKG is   ordered today.  The ekg ordered today demonstrates sinus bradycardia with mild LVH inf Q waves  Recent Labs: No results found for requested labs within last 365 days.   Recent Lipid Panel No results for input(s): "CHOL", "TRIG", "HDL", "VLDL", "LDLCALC", "LDLDIRECT" in the last 8760 hours.   Prior CV Studies:   2D echo 12/19/2014 Study Conclusions   - Left ventricle: The cavity size was normal. Wall thickness was    normal. Systolic function was normal. The estimated ejection    fraction was in the range of 55% to 60%. Wall motion was normal;    there were no regional wall motion abnormalities. Left    ventricular diastolic function parameters were normal.  - Mitral valve: There was mild regurgitation.  - Right atrium: The atrium  was mildly dilated.      Risk Assessment/Calculations/Metrics:    CHA2DS2-VASc Score = 0   This indicates a 0.2% annual risk of stroke. The patient's score is based upon: CHF History: 0 HTN History: 0 Diabetes History: 0 Stroke History: 0 Vascular Disease History: 0 Age Score: 0 Gender Score: 0              ASSESSMENT & PLAN:   No problem-specific Assessment & Plan notes found for this encounter.   PAF with RVR 1 episode in 2016 CHA2DS2-VASc score 0, occasionally has a flutter but not sustained. Continue metoprolol. Check labs today.   Tobacco abuse-smoking cessation discussed. Also recommend CT for lung cancer screening. He says he'll do through his PCP  HLD-intolerant to pravastatin. Will check today.             Dispo:  No follow-ups on file.   Medication Adjustments/Labs and Tests Ordered: Current medicines are reviewed at length with the patient today.  Concerns regarding medicines are outlined above.  Tests Ordered: No orders of the defined types were placed in this encounter.  Medication Changes: No orders of the defined types were placed in this encounter.  Elson Clan, PA-C  07/23/2022 8:19 AM    [facility] [address] [place], [place], [place]  [address] Phone: [phone]; Fax: [phone]

## 2022-07-23 ENCOUNTER — Encounter: Payer: Self-pay | Admitting: Physician Assistant

## 2022-07-23 ENCOUNTER — Ambulatory Visit: Payer: BC Managed Care – PPO | Attending: Physician Assistant | Admitting: Physician Assistant

## 2022-07-23 VITALS — BP 128/84 | HR 57 | Ht 73.0 in | Wt 222.6 lb

## 2022-07-23 DIAGNOSIS — I480 Paroxysmal atrial fibrillation: Secondary | ICD-10-CM

## 2022-07-23 DIAGNOSIS — E7849 Other hyperlipidemia: Secondary | ICD-10-CM | POA: Diagnosis not present

## 2022-07-23 DIAGNOSIS — Z720 Tobacco use: Secondary | ICD-10-CM | POA: Diagnosis not present

## 2022-07-23 MED ORDER — METOPROLOL SUCCINATE ER 50 MG PO TB24: 50.0000 mg | ORAL_TABLET | Freq: Every day | ORAL | 11 refills | Status: DC

## 2022-07-23 NOTE — Patient Instructions (Signed)
Medication Instructions:  Your physician recommends that you continue on your current medications as directed. Please refer to the Current Medication list given to you today.  *If you need a refill on your cardiac medications before your next appointment, please call your pharmacy*   Lab Work: Lipids, Cmp, Cbc, Tsh- today   If you have labs (blood work) drawn today and your tests are completely normal, you will receive your results only by: MyChart Message (if you have MyChart) OR A paper copy in the mail If you have any lab test that is abnormal or we need to change your treatment, we will call you to review the results.   Testing/Procedures: None ordered    Follow-Up: At [facility], you and your health needs are our priority.  As part of our continuing mission to provide you with exceptional heart care, we have created designated Provider Care Teams.  These Care Teams include your primary Cardiologist (physician) and Advanced Practice Providers (APPs -  Physician Assistants and Nurse Practitioners) who all work together to provide you with the care you need, when you need it.  We recommend signing up for the patient portal called "MyChart".  Sign up information is provided on this After Visit Summary.  MyChart is used to connect with patients for Virtual Visits (Telemedicine).  Patients are able to view lab/test results, encounter notes, upcoming appointments, etc.  Non-urgent messages can be sent to your provider as well.   To learn more about what you can do with MyChart, go to [email or web address].    Your next appointment:   12 month(s)  Provider:   Dr. Donato Schultz   Other Instructions

## 2022-07-24 LAB — LIPID PANEL
Chol/HDL Ratio: 3.5 ratio (ref 0.0–5.0)
Cholesterol, Total: 145 mg/dL (ref 100–199)
HDL: 42 mg/dL (ref >39)
LDL Chol Calc (NIH): 86 mg/dL (ref 0–99)
Triglycerides: 87 mg/dL (ref 0–149)
VLDL Cholesterol Cal: 17 mg/dL (ref 5–40)

## 2022-07-24 LAB — COMPREHENSIVE METABOLIC PANEL
ALT: 15 IU/L (ref 0–44)
AST: 14 IU/L (ref 0–40)
Albumin/Globulin Ratio: 2.4 — ABNORMAL HIGH (ref 1.2–2.2)
Albumin: 4.5 g/dL (ref 3.9–4.9)
Alkaline Phosphatase: 61 IU/L (ref 44–121)
BUN/Creatinine Ratio: 13 (ref 10–24)
BUN: 11 mg/dL (ref 8–27)
Bilirubin Total: 0.5 mg/dL (ref 0.0–1.2)
CO2: 24 mmol/L (ref 20–29)
Calcium: 9.7 mg/dL (ref 8.6–10.2)
Chloride: 106 mmol/L (ref 96–106)
Creatinine, Ser: 0.87 mg/dL (ref 0.76–1.27)
Globulin, Total: 1.9 g/dL (ref 1.5–4.5)
Glucose: 97 mg/dL (ref 70–99)
Potassium: 4.4 mmol/L (ref 3.5–5.2)
Sodium: 145 mmol/L — ABNORMAL HIGH (ref 134–144)
Total Protein: 6.4 g/dL (ref 6.0–8.5)
eGFR: 98 mL/min/{1.73_m2} (ref >59)

## 2022-07-24 LAB — CBC
Hematocrit: 43.5 % (ref 37.5–51.0)
Hemoglobin: 14.4 g/dL (ref 13.0–17.7)
MCH: 32.4 pg (ref 26.6–33.0)
MCHC: 33.1 g/dL (ref 31.5–35.7)
MCV: 98 fL — ABNORMAL HIGH (ref 79–97)
Platelets: 181 10*3/uL (ref 150–450)
RBC: 4.45 x10E6/uL (ref 4.14–5.80)
RDW: 12.1 % (ref 11.6–15.4)
WBC: 6.3 10*3/uL (ref 3.4–10.8)

## 2022-07-24 LAB — TSH: TSH: 1.25 u[IU]/mL (ref 0.450–4.500)

## 2023-07-14 NOTE — Progress Notes (Signed)
 Cardiology Office Note:  .   Date:  07/22/2023  ID:  Dale Dalton, DOB [birth year], MRN [id number] PCP: Kahoano, Haku K, MD  [facility] Providers Cardiologist:  Dale Albany, MD (Inactive)    History of Present Illness: .   Dale Dalton is a 63 y.o. male   with  history of 1 episode of atrial fibrillation with RVR 2016 CHA2DS2-VASc equals 0 and no recurrence, tobacco abuse.  I saw patient 07/2021 and he thought he had one episode of Afib when he had covid19 lasting less than 5 min.  LOV 07/2022 he had occasional fluttering if he drank too much caffeine or didn't stay hydrated.  Patient here for yearly f/u. Works at [employer] center and does a lot of walking. Patient had some palpitations off/on when he missed his toprol  one day. Otherwise no episodes. Patient says he has trouble if working in the heat for a long time and has to take a break. Heart wasn't racing. Still smoking 7-10 cigarettes a day.   ROS:    Studies Reviewed: Dale Dalton    EKG Interpretation Date/Time:  Tuesday Jul 22 2023 09:11:29 EDT Ventricular Rate:  65 PR Interval:  144 QRS Duration:  86 QT Interval:  392 QTC Calculation: 407 R Axis:   -3  Text Interpretation: Normal sinus rhythm Minimal voltage criteria for LVH, may be normal variant ( R in aVL ) When compared with ECG of 18-Dec-2014 14:34, T wave inversion more evident in Inferior leads Nonspecific T wave abnormality now evident in Anterior leads Confirmed by Dale Dalton [phone]) on 07/22/2023 9:28:11 AM    Prior CV Studies:   2D echo 12/19/2014 Study Conclusions   - Left ventricle: The cavity size was normal. Wall thickness was    normal. Systolic function was normal. The estimated ejection    fraction was in the range of 55% to 60%. Wall motion was normal;    there were no regional wall motion abnormalities. Left    ventricular diastolic function parameters were normal.  - Mitral valve: There was mild regurgitation.  - Right atrium:  The atrium was mildly dilated.   Risk Assessment/Calculations:    CHA2DS2-VASc Score = 0   This indicates a 0.2% annual risk of stroke. The patient's score is based upon: CHF History: 0 HTN History: 0 Diabetes History: 0 Stroke History: 0 Vascular Disease History: 0 Age Score: 0 Gender Score: 0             Physical Exam:   VS:  BP 118/74   Pulse 65   Ht 6\' 1"  (1.854 m)   Wt 228 lb (103.4 kg)   SpO2 96%   BMI 30.08 kg/m    Wt Readings from Last 3 Encounters:  07/22/23 228 lb (103.4 kg)  07/23/22 222 lb 9.6 oz (101 kg)  07/10/21 220 lb 9.6 oz (100.1 kg)    GEN: Obese, in no acute distress NECK: No JVD; No carotid bruits CARDIAC:  RRR, no murmurs, rubs, gallops RESPIRATORY:  Clear to auscultation without rales, wheezing or rhonchi  ABDOMEN: Soft, non-tender, non-distended EXTREMITIES:  No edema; No deformity   ASSESSMENT AND PLAN: .    PAF with RVR 1 episode in 2016 CHA2DS2-VASc score 0, occasionally has a flutter but not sustained-occurs mostly when he misses a dose of toprol . Continue metoprolol . Check labs today.   Tobacco abuse-smoking cessation discussed. Also recommend CT for lung cancer screening. He says he wants to think about it.  HLD-intolerant to pravastatin. Will check today.   Obesity -diet and exercise discussed          Dispo: f/u in 1 yr  Signed, Dale Flake, PA-C

## 2023-07-22 ENCOUNTER — Ambulatory Visit: Attending: Physician Assistant | Admitting: Physician Assistant

## 2023-07-22 ENCOUNTER — Encounter: Payer: Self-pay | Admitting: Physician Assistant

## 2023-07-22 VITALS — BP 118/74 | HR 65 | Ht 73.0 in | Wt 228.0 lb

## 2023-07-22 DIAGNOSIS — E7849 Other hyperlipidemia: Secondary | ICD-10-CM

## 2023-07-22 DIAGNOSIS — E669 Obesity, unspecified: Secondary | ICD-10-CM

## 2023-07-22 DIAGNOSIS — Z720 Tobacco use: Secondary | ICD-10-CM | POA: Diagnosis not present

## 2023-07-22 DIAGNOSIS — I4891 Unspecified atrial fibrillation: Secondary | ICD-10-CM | POA: Diagnosis not present

## 2023-07-22 LAB — HEMOGLOBIN A1C
Est. average glucose Bld gHb Est-mCnc: 117 mg/dL
Hgb A1c MFr Bld: 5.7 % — ABNORMAL HIGH (ref 4.8–5.6)

## 2023-07-22 MED ORDER — METOPROLOL SUCCINATE ER 50 MG PO TB24
50.0000 mg | ORAL_TABLET | Freq: Every day | ORAL | 11 refills | Status: AC
Start: 2023-07-22 — End: ?

## 2023-07-22 NOTE — Patient Instructions (Addendum)
 Medication Instructions:  Your physician recommends that you continue on your current medications as directed. Please refer to the Current Medication list given to you today.  *If you need a refill on your cardiac medications before your next appointment, please call your pharmacy*  Lab Work: TODAY: CMET, CBC, FASTING LIPIDS, TSH, A1C If you have labs (blood work) drawn today and your tests are completely normal, you will receive your results only by: MyChart Message (if you have MyChart) OR A paper copy in the mail If you have any lab test that is abnormal or we need to change your treatment, we will call you to review the results.  Testing/Procedures: NONE  Follow-Up: At [facility], you and your health needs are our priority.  As part of our continuing mission to provide you with exceptional heart care, our providers are all part of one team.  This team includes your primary Cardiologist (physician) and Advanced Practice Providers or APPs (Physician Assistants and Nurse Practitioners) who all work together to provide you with the care you need, when you need it.  Your next appointment:   1 year(s)  Provider:   DR. Rolm Clos  We recommend signing up for the patient portal called "MyChart".  Sign up information is provided on this After Visit Summary.  MyChart is used to connect with patients for Virtual Visits (Telemedicine).  Patients are able to view lab/test results, encounter notes, upcoming appointments, etc.  Non-urgent messages can be sent to your provider as well.   To learn more about what you can do with MyChart, go to [email or web address].   Other Instructions     Exercising to Stay Healthy To become healthy and stay healthy, it is recommended that you do moderate-intensity and vigorous-intensity exercise. You can tell that you are exercising at a moderate intensity if your heart starts beating faster and you start breathing faster but can still hold a  conversation. You can tell that you are exercising at a vigorous intensity if you are breathing much harder and faster and cannot hold a conversation while exercising. How can exercise benefit me? Exercising regularly is important. It has many health benefits, such as: Improving overall fitness, flexibility, and endurance. Increasing bone density. Helping with weight control. Decreasing body fat. Increasing muscle strength and endurance. Reducing stress and tension, anxiety, depression, or anger. Improving overall health. What guidelines should I follow while exercising? Before you start a new exercise program, talk with your health care provider. Do not exercise so much that you hurt yourself, feel dizzy, or get very short of breath. Wear comfortable clothes and wear shoes with good support. Drink plenty of water while you exercise to prevent dehydration or heat stroke. Work out until your breathing and your heartbeat get faster (moderate intensity). How often should I exercise? Choose an activity that you enjoy, and set realistic goals. Your health care provider can help you make an activity plan that is individually designed and works best for you. Exercise regularly as told by your health care provider. This may include: Doing strength training two times a week, such as: Lifting weights. Using resistance bands. Push-ups. Sit-ups. Yoga. Doing a certain intensity of exercise for a given amount of time. Choose from these options: A total of 150 minutes of moderate-intensity exercise every week. A total of 75 minutes of vigorous-intensity exercise every week. A mix of moderate-intensity and vigorous-intensity exercise every week. Children, pregnant women, people who have not exercised regularly, people who are overweight, and older adults  may need to talk with a health care provider about what activities are safe to perform. If you have a medical condition, be sure to talk with your  health care provider before you start a new exercise program. What are some exercise ideas? Moderate-intensity exercise ideas include: Walking 1 mile (1.6 km) in about 15 minutes. Biking. Hiking. Golfing. Dancing. Water aerobics. Vigorous-intensity exercise ideas include: Walking 4.5 miles (7.2 km) or more in about 1 hour. Jogging or running 5 miles (8 km) in about 1 hour. Biking 10 miles (16.1 km) or more in about 1 hour. Lap swimming. Roller-skating or in-line skating. Cross-country skiing. Vigorous competitive sports, such as football, basketball, and soccer. Jumping rope. Aerobic dancing. What are some everyday activities that can help me get exercise? Yard work, such as: Child psychotherapist. Raking and bagging leaves. Washing your car. Pushing a stroller. Shoveling snow. Gardening. Washing windows or floors. How can I be more active in my day-to-day activities? Use stairs instead of an elevator. Take a walk during your lunch break. If you drive, park your car farther away from your work or school. If you take public transportation, get off one stop early and walk the rest of the way. Stand up or walk around during all of your indoor phone calls. Get up, stretch, and walk around every 30 minutes throughout the day. Enjoy exercise with a friend. Support to continue exercising will help you keep a regular routine of activity. Where to find more information You can find more information about exercising to stay healthy from: U.S. Department of Health and Human Services: ThisPath.fi Centers for Disease Control and Prevention (CDC): FootballExhibition.com.br Summary Exercising regularly is important. It will improve your overall fitness, flexibility, and endurance. Regular exercise will also improve your overall health. It can help you control your weight, reduce stress, and improve your bone density. Do not exercise so much that you hurt yourself, feel dizzy, or get very short of  breath. Before you start a new exercise program, talk with your health care provider. This information is not intended to replace advice given to you by your health care provider. Make sure you discuss any questions you have with your health care provider. Document Revised: 06/23/2020 Document Reviewed: 06/23/2020 Elsevier Patient Education  2024 [employer].

## 2023-07-24 ENCOUNTER — Ambulatory Visit: Payer: Self-pay | Admitting: Physician Assistant

## 2023-07-28 LAB — CBC
Hematocrit: 44.2 % (ref 37.5–51.0)
Hemoglobin: 14.8 g/dL (ref 13.0–17.7)
MCH: 33.4 pg — ABNORMAL HIGH (ref 26.6–33.0)
MCHC: 33.5 g/dL (ref 31.5–35.7)
MCV: 100 fL — ABNORMAL HIGH (ref 79–97)
Platelets: 155 10*3/uL (ref 150–450)
RBC: 4.43 x10E6/uL (ref 4.14–5.80)
RDW: 12.2 % (ref 11.6–15.4)
WBC: 5.9 10*3/uL (ref 3.4–10.8)

## 2023-07-28 NOTE — Telephone Encounter (Signed)
 Spoke with patient on 07/24/23 who returned my call said that he will come back to our office to get more blood work completed that was not drawn during the same time he was to get his A1C drawn.

## 2023-07-28 NOTE — Telephone Encounter (Signed)
-----   Message from Theotis Flake sent at 07/24/2023  7:56 AM EDT ----- Can you see why CBC, CMET, TSH and lipid panel aren't back? They only did the A1C? Thanks

## 2023-07-29 LAB — COMPREHENSIVE METABOLIC PANEL WITH GFR
ALT: 11 IU/L (ref 0–44)
AST: 18 IU/L (ref 0–40)
Albumin: 4.2 g/dL (ref 3.9–4.9)
Alkaline Phosphatase: 57 IU/L (ref 44–121)
BUN/Creatinine Ratio: 14 (ref 10–24)
BUN: 14 mg/dL (ref 8–27)
Bilirubin Total: 1 mg/dL (ref 0.0–1.2)
CO2: 20 mmol/L (ref 20–29)
Calcium: 9.1 mg/dL (ref 8.6–10.2)
Chloride: 105 mmol/L (ref 96–106)
Creatinine, Ser: 0.99 mg/dL (ref 0.76–1.27)
Globulin, Total: 1.8 g/dL (ref 1.5–4.5)
Glucose: 85 mg/dL (ref 70–99)
Potassium: 4.8 mmol/L (ref 3.5–5.2)
Sodium: 140 mmol/L (ref 134–144)
Total Protein: 6 g/dL (ref 6.0–8.5)
eGFR: 86 mL/min/{1.73_m2} (ref >59)

## 2023-07-29 LAB — LIPID PANEL
Chol/HDL Ratio: 3.7 ratio (ref 0.0–5.0)
Cholesterol, Total: 153 mg/dL (ref 100–199)
HDL: 41 mg/dL (ref >39)
LDL Chol Calc (NIH): 95 mg/dL (ref 0–99)
Triglycerides: 93 mg/dL (ref 0–149)
VLDL Cholesterol Cal: 17 mg/dL (ref 5–40)

## 2023-07-29 LAB — TSH: TSH: 1.4 u[IU]/mL (ref 0.450–4.500)

## 2023-09-15 ENCOUNTER — Telehealth: Payer: Self-pay | Admitting: Physician Assistant

## 2023-09-15 NOTE — Telephone Encounter (Signed)
 Pt would like to know if its okay for him to take allergy medication.

## 2023-09-16 NOTE — Telephone Encounter (Signed)
 Spoke with patient and shared message from pharmacist:  Yes, ok to take some allergy medications. Should NOT take anything with pseudoephedrine or phenylephrine in it (ie Sudafed or things that say D or PE after it)   Clartin/allegra ect are fine. So is flonase or astelin.     Patient verbalized understanding and expressed appreciation for follow-up.

## 2023-09-16 NOTE — Telephone Encounter (Signed)
 Yes, ok to take some allergy medications. Should NOT take anything with pseudoephedrine or phenylephrine in it (ie Sudafed or things that say D or PE after it)  Clartin/allegra ect are fine. So is flonase or astelin.
